# Patient Record
Sex: Male | Born: 1965
Health system: Southern US, Community
[De-identification: ages and names within clinical notes are randomized; demographics above are authoritative.]

## PROBLEM LIST (undated history)

## (undated) DIAGNOSIS — K219 Gastro-esophageal reflux disease without esophagitis: Secondary | ICD-10-CM

## (undated) DIAGNOSIS — T7840XA Allergy, unspecified, initial encounter: Secondary | ICD-10-CM

## (undated) HISTORY — PX: HERNIA REPAIR: SHX51

## (undated) HISTORY — DX: Gastro-esophageal reflux disease without esophagitis: K21.9

## (undated) HISTORY — DX: Allergy, unspecified, initial encounter: T78.40XA

---

## 2014-01-03 ENCOUNTER — Ambulatory Visit (INDEPENDENT_AMBULATORY_CARE_PROVIDER_SITE_OTHER): Payer: BC Managed Care – PPO | Admitting: Emergency Medicine

## 2014-01-03 VITALS — BP 116/60 | HR 64 | Temp 98.0°F | Resp 18 | Ht 67.0 in | Wt 173.0 lb

## 2014-01-03 DIAGNOSIS — K2990 Gastroduodenitis, unspecified, without bleeding: Principal | ICD-10-CM

## 2014-01-03 DIAGNOSIS — K2970 Gastritis, unspecified, without bleeding: Secondary | ICD-10-CM

## 2014-01-03 LAB — POCT CBC
Granulocyte percent: 68 %G (ref 37–80)
HCT, POC: 49.4 % (ref 43.5–53.7)
Hemoglobin: 15.9 g/dL (ref 14.1–18.1)
Lymph, poc: 2.7 (ref 0.6–3.4)
MCH, POC: 31.1 pg (ref 27–31.2)
MCHC: 32.3 g/dL (ref 31.8–35.4)
MCV: 96.4 fL (ref 80–97)
MID (cbc): 0.2 (ref 0–0.9)
MPV: 6.5 fL (ref 0–99.8)
POC Granulocyte: 6.1 (ref 2–6.9)
POC LYMPH PERCENT: 29.5 %L (ref 10–50)
POC MID %: 2.5 %M (ref 0–12)
Platelet Count, POC: 239 10*3/uL (ref 142–424)
RBC: 5.12 M/uL (ref 4.69–6.13)
RDW, POC: 14.6 %
WBC: 9 10*3/uL (ref 4.6–10.2)

## 2014-01-03 LAB — COMPREHENSIVE METABOLIC PANEL
ALT: 79 U/L — ABNORMAL HIGH (ref 0–53)
AST: 74 U/L — ABNORMAL HIGH (ref 0–37)
Albumin: 5 g/dL (ref 3.5–5.2)
Alkaline Phosphatase: 59 U/L (ref 39–117)
BUN: 12 mg/dL (ref 6–23)
CO2: 31 mEq/L (ref 19–32)
Calcium: 10.4 mg/dL (ref 8.4–10.5)
Chloride: 102 mEq/L (ref 96–112)
Creat: 0.89 mg/dL (ref 0.50–1.35)
Glucose, Bld: 86 mg/dL (ref 70–99)
Potassium: 4.3 mEq/L (ref 3.5–5.3)
Sodium: 141 mEq/L (ref 135–145)
Total Bilirubin: 0.5 mg/dL (ref 0.2–1.2)
Total Protein: 7.4 g/dL (ref 6.0–8.3)

## 2014-01-03 MED ORDER — LANSOPRAZOLE 30 MG PO CPDR: 30.0000 mg | DELAYED_RELEASE_CAPSULE | Freq: Every day | ORAL | Status: DC

## 2014-01-03 MED ORDER — SUCRALFATE 1 G PO TABS: ORAL_TABLET | ORAL | Status: DC

## 2014-01-03 NOTE — Patient Instructions (Signed)
Gastritis - Adultos  °(Gastritis, Adult) ° La gastrittis es la irritación (inflamación) de la membrana interna del estómago. Puede ser una enfermedad de inicio súbito (aguda) o de largo plazo (crónica). Si la gastritis no se trata, puede causar sangrado y úlceras. °CAUSAS  °La gastritis se produce cuando la membrana que tapiza interiormente al estómago se debilita o se daña. Los jugos digestivos del estómago inflaman el revestimiento del estómago debilitado. El revestimiento del estómago puede debilitarse o dañarse por una infección viral o bacteriana. La infección bacteriana más común es la infección por Helicobacter pylori. También puede ser el resultado del consumo excesivo de alcohol, por el uso de ciertos medicamentos o porque hay demasiado ácido en el estómago.  °SÍNTOMAS  °En algunos casos no hay síntomas. Si se presentan síntomas, éstos pueden ser:  °· Dolor o sensación de ardor en la parte superior del abdomen. °· Náuseas. °· Vómitos. °· Sensación molesta de distensión después de comer. °DIAGNÓSTICO  °El médico puede diagnosticar gastritis según los síntomas y el examen físico. Para determinar la causa de la gastritis, el médico podrá:  °· Pedir análisis de sangre o de materia fecal para diagnosticar la presencia de la bacteria H pylori. °· Gastroscopía. Un tubo delgado y flexible (endoscopio) se pasa por el esófago hasta llegar al estómago. El endoscopio tiene una luz y una cámara en el extremo. El médico utilizará el endoscopio para observar el interior del estómago. °· Tomará una muestra de tejido (biopsia) del estómago para examinarlo en el microscopio. °TRATAMIENTO  °Según la causa de la gastritis podrán recetarle: Antibióticos, si la causa es una infección bacteriana, como una infección por H. pylori. Antiácidos o bloqueadores H2, si hay demasiado ácido en el estómago. El médico le aconsejará que deje de tomar aspirina, ibuprofeno u otros antiinflamatorios no esteroides (AINE).  °INSTRUCCIONES PARA EL  CUIDADO EN EL HOGAR  °· Tome sólo medicamentos de venta libre o recetados, según las indicaciones del médico. °· Si le han recetado antibióticos, tómelos según las indicaciones. Tómelos todos, aunque se sienta mejor. °· Debe ingerir gran cantidad de líquido para mantener la orina de tono claro o color amarillo pálido. °· Evite las comidas y bebidas que empeoran los problemas, como: °¨ Bebidas con cafeína o alcohólicas. °¨ Chocolate. °¨ Sabores a menta. °¨ Ajo y cebolla. °¨ Comidas muy condimentadas. °¨ Cítricos como naranjas, limones o limas. °¨ Alimentos que contengan tomate, como salsas, chile y pizza. °¨ Alimentos fritos y grasos. °· Haga comidas pequeñas durante el día en lugar de 3 comidas abundantes. °SOLICITE ATENCIÓN MÉDICA DE INMEDIATO SI:  °· La materia fecal es negra o de color rojo oscuro. °· Vomita sangre de color rojo brillante o material similar a granos de café. °· No puede retener los líquidos. °· El dolor abdominal empeora. °· Tiene fiebre. °· No mejora luego de 1 semana. °· Tiene preguntas o preocupaciones. °ASEGÚRESE DE QUE:  °· Comprende estas instrucciones. °· Controlará su enfermedad. °· Solicitará ayuda de inmediato si no mejora o si empeora. °Document Released: 02/06/2005 Document Revised: 01/22/2012 °ExitCare® Patient Information ©2015 [employer]. This information is not intended to replace advice given to you by your health care provider. Make sure you discuss any questions you have with your health care provider. ° °

## 2014-01-03 NOTE — Progress Notes (Signed)
[facility] [address] [place] [address] [phone]  Date:  01/03/2014   Name:  Russell Hunt   DOB:  August 23, 1965   MRN:  [id number]  PCP:  No PCP Per Patient    Chief Complaint: Bloated   History of Present Illness:  Russell Hunt is a 48 y.o. very pleasant male patient who presents with the following:  History of three months duration bloating and abdominal pain with eating. No waterbrash.  Frequent heartburn. No nausea or vomiting. No excess alcohol or caffeine. Says pain increases with coffee.  Increases when he bends over. No blood in stools, black stools or vomiting blood.   No improvement with over the counter medications or other home remedies. /jd  There are no active problems to display for this patient.   Past Medical History  Diagnosis Date  . Allergy     Past Surgical History  Procedure Laterality Date  . Hernia repair      History  Substance Use Topics  . Smoking status: Current Every Day Smoker -- 0.50 packs/day for 30 years    Types: Cigarettes  . Smokeless tobacco: Not on file  . Alcohol Use: Yes    Family History  Problem Relation Age of Onset  . Diabetes Mother     No Known Allergies  Medication list has been reviewed and updated.  No current outpatient prescriptions on file prior to visit.   No current facility-administered medications on file prior to visit.    Review of Systems:  As per HPI, otherwise negative.    Physical Examination: Filed Vitals:   01/03/14 1558  BP: 116/60  Pulse: 64  Temp: 98 F (36.7 C)  Resp: 18   Filed Vitals:   01/03/14 1558  Height: 5\' 7"  (1.702 m)  Weight: 173 lb (78.472 kg)   Body mass index is 27.09 kg/(m^2). Ideal Body Weight: Weight in (lb) to have BMI = 25: 159.3  GEN: WDWN, NAD, Non-toxic, A & O x 3 HEENT: Atraumatic, Normocephalic. Neck supple. No masses, No LAD. Ears and Nose: No external deformity. CV: RRR, No M/G/R. No JVD. No thrill.  No extra heart sounds. PULM: CTA B, no wheezes, crackles, rhonchi. No retractions. No resp. distress. No accessory muscle use. ABD: S, NT, ND, +BS. No rebound. No HSM. EXTR: No c/c/e NEURO Normal gait.  PSYCH: Normally interactive. Conversant. Not depressed or anxious appearing.  Calm demeanor.    Assessment and Plan: Gastritis vs ulcer vs GB Prevacid Labs pending Follow up in one month    Signed,  Ellison Carwin, MD

## 2014-01-04 LAB — H. PYLORI ANTIBODY, IGG: H Pylori IgG: <0.4 {ISR}

## 2014-01-11 ENCOUNTER — Telehealth: Payer: Self-pay

## 2014-01-11 NOTE — Telephone Encounter (Signed)
Patient contacted [facility] for his lab results.   Bethena Roys at [facility] called for [place].  Bethena Roys can be reached at [phone]

## 2014-01-11 NOTE — Telephone Encounter (Signed)
Spoke to [place]- pt called [place] in error, he was trying to set up MyChart and called to reset password but he has not activated his account yet.

## 2014-01-11 NOTE — Telephone Encounter (Signed)
Spoke to pt- advised to activate MyChart with the code given at his appt. Pt advised of results.

## 2014-04-01 ENCOUNTER — Ambulatory Visit (INDEPENDENT_AMBULATORY_CARE_PROVIDER_SITE_OTHER): Payer: BC Managed Care – PPO | Admitting: Family Medicine

## 2014-04-01 VITALS — BP 126/78 | HR 61 | Temp 98.3°F | Resp 16 | Ht 67.5 in | Wt 174.2 lb

## 2014-04-01 DIAGNOSIS — T161XXA Foreign body in right ear, initial encounter: Secondary | ICD-10-CM

## 2014-04-01 NOTE — Progress Notes (Signed)
[facility] [address] [place] [address] [phone]  Date:  04/01/2014   Name:  Russell Hunt   DOB:  1966/01/03   MRN:  [id number]  PCP:  No PCP Per Patient    Chief Complaint: Ear Pain   History of Present Illness:  Russell Hunt is a 48 y.o. very pleasant male patient who presents with the following:  He is here today today with a possible retained piece of cotton in his right ear- happened yesterday.  It is uncomfortable since he got the qtip stuck.   OW generally healthy  There are no active problems to display for this patient.   Past Medical History  Diagnosis Date  . Allergy     Past Surgical History  Procedure Laterality Date  . Hernia repair      History  Substance Use Topics  . Smoking status: Current Every Day Smoker -- 0.50 packs/day for 30 years    Types: Cigarettes  . Smokeless tobacco: Not on file  . Alcohol Use: Yes    Family History  Problem Relation Age of Onset  . Diabetes Mother     No Known Allergies  Medication list has been reviewed and updated.  No current outpatient prescriptions on file prior to visit.   No current facility-administered medications on file prior to visit.    Review of Systems:  As per HPI- otherwise negative.   Physical Examination: Filed Vitals:   04/01/14 1251  BP: 126/78  Pulse: 61  Temp: 98.3 F (36.8 C)  Resp: 16   Filed Vitals:   04/01/14 1251  Height: 5' 7.5" (1.715 m)  Weight: 174 lb 3.2 oz (79.017 kg)   Body mass index is 26.87 kg/(m^2). Ideal Body Weight: Weight in (lb) to have BMI = 25: 161.7   GEN: WDWN, NAD, Non-toxic, Alert & Oriented x 3 HEENT: Atraumatic, Normocephalic.   Left ear canal and TM wnl Ears and Nose: No external deformity. EXTR: No clubbing/cyanosis/edema NEURO: Normal gait.  PSYCH: Normally interactive. Conversant. Not depressed or anxious appearing.  Calm demeanor.  [place] visualized in right ear.  Removed with  alligator forceps. TM and ear canal then appeared normal   Assessment and Plan: Foreign body in right ear, initial encounter  Resolved as above.  Follow-up if needed  Signed Lamar Blinks, MD

## 2014-06-15 ENCOUNTER — Ambulatory Visit (INDEPENDENT_AMBULATORY_CARE_PROVIDER_SITE_OTHER): Payer: BLUE CROSS/BLUE SHIELD | Admitting: Urgent Care

## 2014-06-15 ENCOUNTER — Ambulatory Visit (INDEPENDENT_AMBULATORY_CARE_PROVIDER_SITE_OTHER): Payer: BLUE CROSS/BLUE SHIELD

## 2014-06-15 ENCOUNTER — Other Ambulatory Visit: Payer: Self-pay | Admitting: Urgent Care

## 2014-06-15 VITALS — BP 108/70 | HR 56 | Temp 98.3°F | Resp 16 | Ht 67.0 in | Wt 173.0 lb

## 2014-06-15 DIAGNOSIS — M545 Low back pain: Secondary | ICD-10-CM

## 2014-06-15 DIAGNOSIS — R748 Abnormal levels of other serum enzymes: Secondary | ICD-10-CM

## 2014-06-15 DIAGNOSIS — M542 Cervicalgia: Secondary | ICD-10-CM

## 2014-06-15 DIAGNOSIS — R1032 Left lower quadrant pain: Secondary | ICD-10-CM

## 2014-06-15 DIAGNOSIS — Z131 Encounter for screening for diabetes mellitus: Secondary | ICD-10-CM

## 2014-06-15 DIAGNOSIS — M79622 Pain in left upper arm: Secondary | ICD-10-CM

## 2014-06-15 LAB — HEMOGLOBIN A1C
Hgb A1c MFr Bld: 5.4 % (ref <5.7)
Mean Plasma Glucose: 108 mg/dL (ref <117)

## 2014-06-15 LAB — POCT URINALYSIS DIPSTICK
Bilirubin, UA: NEGATIVE
Blood, UA: NEGATIVE
Glucose, UA: NEGATIVE
Ketones, UA: NEGATIVE
Leukocytes, UA: NEGATIVE
Nitrite, UA: NEGATIVE
Protein, UA: NEGATIVE
Spec Grav, UA: 1.025
Urobilinogen, UA: 0.2
pH, UA: 5.5

## 2014-06-15 LAB — POCT CBC
Granulocyte percent: 61.1 %G (ref 37–80)
HCT, POC: 49.5 % (ref 43.5–53.7)
Hemoglobin: 16.2 g/dL (ref 14.1–18.1)
Lymph, poc: 2.6 (ref 0.6–3.4)
MCH, POC: 31.6 pg — AB (ref 27–31.2)
MCHC: 32.8 g/dL (ref 31.8–35.4)
MCV: 96.4 fL (ref 80–97)
MID (cbc): 0.6 (ref 0–0.9)
MPV: 6.1 fL (ref 0–99.8)
POC Granulocyte: 4.9 (ref 2–6.9)
POC LYMPH PERCENT: 32.1 %L (ref 10–50)
POC MID %: 6.8 %M (ref 0–12)
Platelet Count, POC: 259 10*3/uL (ref 142–424)
RBC: 5.13 M/uL (ref 4.69–6.13)
RDW, POC: 14.6 %
WBC: 8.1 10*3/uL (ref 4.6–10.2)

## 2014-06-15 LAB — POCT UA - MICROSCOPIC ONLY
Bacteria, U Microscopic: NEGATIVE
Casts, Ur, LPF, POC: NEGATIVE
Crystals, Ur, HPF, POC: NEGATIVE
Mucus, UA: NEGATIVE
RBC, urine, microscopic: NEGATIVE
WBC, Ur, HPF, POC: NEGATIVE
Yeast, UA: NEGATIVE

## 2014-06-15 LAB — LIPID PANEL
Cholesterol: 216 mg/dL — ABNORMAL HIGH (ref 0–200)
HDL: 56 mg/dL (ref >39)
LDL Cholesterol: 143 mg/dL — ABNORMAL HIGH (ref 0–99)
Total CHOL/HDL Ratio: 3.9 Ratio
Triglycerides: 84 mg/dL (ref <150)
VLDL: 17 mg/dL (ref 0–40)

## 2014-06-15 MED ORDER — MELOXICAM 7.5 MG PO TABS: 7.5000 mg | ORAL_TABLET | Freq: Every day | ORAL | Status: AC

## 2014-06-15 MED ORDER — CYCLOBENZAPRINE HCL 10 MG PO TABS: 10.0000 mg | ORAL_TABLET | Freq: Three times a day (TID) | ORAL | Status: DC | PRN

## 2014-06-15 NOTE — Patient Instructions (Signed)
Estreimiento (Constipation) Estreimiento significa que una persona tiene menos de tres evacuaciones en una semana, dificultad para defecar, o que las heces son secas, duras, o ms grandes que lo normal. A medida que envejecemos el estreimiento es ms comn. Si intenta curar el estreimiento con medicamentos que producen la evacuacin de las heces (laxantes), el problema puede empeorar. El uso prolongado de laxantes puede hacer que los msculos del colon se debiliten. Una dieta baja en fibra, no tomar suficientes lquidos y el uso de ciertos medicamentos pueden Agricultural engineer.  CAUSAS   Ciertos medicamentos, como los antidepresivos, analgsicos, suplementos de hierro, anticidos y diurticos.  Algunas enfermedades, como la diabetes, el sndrome del colon irritable, enfermedad de la tiroides, o depresin.  No beber suficiente agua.  No consumir suficientes alimentos ricos en fibra.  Situaciones de estrs o viajes.  Falta de actividad fsica o de ejercicio.  Ignorar la necesidad sbita de Landscape architect.  Uso en exceso de laxantes. SIGNOS Y SNTOMAS   Defecar menos de tres veces por semana.  Dificultad para defecar.  Tener las heces secas y duras, o ms grandes que las normales.  Sensacin de estar lleno o hinchado.  Dolor en la parte baja del abdomen.  No sentir alivio despus de defecar. DIAGNSTICO  El mdico le har una historia clnica y un examen fsico. Pueden hacerle exmenes adicionales para el estreimiento grave. Estos estudios pueden ser:  Un radiografa con enema de bario para examinar el recto, el colon y, en algunos casos, el intestino delgado.  Una sigmoidoscopia para examinar el colon inferior.  Una colonoscopia para examinar todo el colon. TRATAMIENTO  El tratamiento depender de la gravedad del estreimiento y de la causa. Algunos tratamientos nutricionales son beber ms lquidos y comer ms alimentos ricos en fibra. El cambio en el estilo de vida  incluye hacer ejercicios de [place] regular. Si estas recomendaciones para Animator dieta y en el estilo de vida no ayudan, el mdico le puede indicar el uso de laxantes de venta libre para ayudarlo a Landscape architect. Los medicamentos recetados se pueden prescribir si los medicamentos de venta libre no lo [place].  INSTRUCCIONES PARA EL CUIDADO EN EL HOGAR   Consuma alimentos con alto contenido de [place], como frutas, vegetales, cereales integrales y porotos.  Limite los alimentos procesados ricos en grasas y azcar, como las papas fritas, hamburguesas, galletas, dulces y refrescos.  Puede agregar un suplemento de fibra a su dieta si no obtiene lo suficiente de los alimentos.  Beba suficiente lquido para Consulting civil engineer orina clara o de color amarillo plido.  Haga ejercicio regularmente o segn las indicaciones del mdico.  Vaya al bao cuando sienta la necesidad de ir. No se aguante las ganas.  Tome solo medicamentos de venta libre o recetados, segn las indicaciones del mdico. No tome otros medicamentos para el estreimiento sin consultarlo antes con su mdico. SOLICITE ATENCIN MDICA DE INMEDIATO SI:   Observa sangre brillante en las heces.  El estreimiento dura ms de 4 das o [place].  Siente dolor abdominal o rectal.  Las heces son delgadas como un lpiz.  Pierde peso de [place] inexplicable. ASEGRESE DE QUE:   Comprende estas instrucciones.  Controlar su afeccin.  Recibir ayuda de inmediato si no mejora o si empeora. Document Released: 05/19/2007 Document Revised: 05/04/2013 [facility] Patient Information 2015 [place]. This information is not intended to replace advice given to you by your health care provider. Make sure you discuss any questions you have with your health  care provider.    Dolor abdominal (Abdominal Pain) El dolor puede tener muchas causas. Normalmente la causa del dolor abdominal no es una enfermedad y Teacher, English as a foreign language sin Clinical research associate.  Frecuentemente puede controlarse y tratarse en casa. Su mdico le Chartered certified accountant examen fsico y posiblemente solicite anlisis de sangre y radiografas para ayudar a Teacher, adult education la gravedad de su dolor. Sin embargo, en [employer], debe transcurrir ms tiempo antes de que se pueda Pension scheme manager una causa evidente del dolor. Antes de llegar a ese punto, es posible que su mdico no sepa si necesita ms pruebas o un tratamiento ms profundo. INSTRUCCIONES PARA EL CUIDADO EN EL HOGAR  Est atento al dolor para ver si hay cambios. Las siguientes indicaciones ayudarn a Chief Strategy Officer que pueda sentir:  Hana solo medicamentos de venta libre o recetados, segn las indicaciones del mdico.  No tome laxantes a menos que se lo haya indicado su mdico.  Pruebe con Ardelia Mems dieta lquida absoluta (caldo, t o agua) segn se lo indique su mdico. Introduzca gradualmente una dieta normal, segn su tolerancia. SOLICITE ATENCIN MDICA SI:  Tiene dolor abdominal sin explicacin.  Tiene dolor abdominal relacionado con nuseas o diarrea.  Tiene dolor cuando orina o defeca.  Experimenta dolor abdominal que lo despierta de noche.  Tiene dolor abdominal que empeora o mejora cuando come alimentos.  Tiene dolor abdominal que empeora cuando come alimentos grasosos.  Tiene fiebre. SOLICITE ATENCIN MDICA DE INMEDIATO SI:   El dolor no desaparece en un plazo mximo de 2horas.  No deja de (vomitar).  El Social research officer, government se siente solo en partes del abdomen, como el lado derecho o la parte inferior izquierda del abdomen.  Evaca materia fecal sanguinolenta o negra, de aspecto alquitranado. ASEGRESE DE QUE:  Comprende estas instrucciones.  Controlar su afeccin.  Recibir ayuda de inmediato si no mejora o si empeora. Document Released: 04/29/2005 Document Revised: 05/04/2013 [facility] Patient Information 2015 [place]. This information is not intended to replace advice given to you by your health care  provider. Make sure you discuss any questions you have with your health care provider.

## 2014-06-15 NOTE — Progress Notes (Signed)
MRN: [id number] DOB: 10-07-1965  Subjective:   Russell Hunt is a 49 y.o. male presenting for chief complaint of Abdominal Pain and Pain in lower back and pelvis  Abdominal pain - reports several month history of lower abdominal pain, bloating, worse with eating. Was seen at [facility] for same complaint in 12/2013, rx prevacid, has also tried omeprazole and Miralax with minimal relief. Associated symptoms include hard stools, straining, has 1 BM per day, low back pain. Low back pain is achy, non-radiating, worsened with prolonged sitting, quick movements. Has to carefully stand from sitting position due to pain. Denies fevers, n/v, diarrhea, bloody stool, chest pain, shob, cough, sore throat, sour brash. Denies family history of colon cancer. Diet is very unhealthy, low fiber, hardly drinks any water. Does not exercise. Mother diagnosed with DM at ~60 y/o.  Neck/right arm pain - reports 1 month history of neck pain radiating to right shoulder/arm. Feels heaviness. Now occuring daily in the evenings after work. Has not tried any interventions for this. Denies numbness, tingling sensation, injury or trauma. 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  Smokes 1/4ppd, 6-12 beers in a sitting once a month. Denies any other aggravating or relieving factors, no other questions or concerns.  Dvid has a current medication list which includes the following prescription(s): polyethylene glycol 3350, cyclobenzaprine, meloxicam, and omeprazole.  He has No Known Allergies.  [place]  has a past medical history of Allergy. Also  has past surgical history that includes Hernia repair.  ROS As in subjective.  Objective:   Vitals: BP 108/70 mmHg  Pulse 56  Temp(Src) 98.3 F (36.8 C) (Oral)  Resp 16  Ht 5\' 7"  (1.702 m)  Wt 173 lb (78.472 kg)  BMI 27.09 kg/m2  SpO2 100%  Physical Exam  Constitutional: He is oriented to person,  place, and time and well-developed, well-nourished, and in no distress.  Cardiovascular: Normal rate, regular rhythm, normal heart sounds and intact distal pulses.  Exam reveals no gallop and no friction rub.   No murmur heard. Pulmonary/Chest: Effort normal and breath sounds normal. No respiratory distress. He has no wheezes. He has no rales. He exhibits no tenderness.  Abdominal: Soft. Bowel sounds are normal. He exhibits no distension and no mass. There is tenderness (in lower abdomen, L>R). There is no guarding.  Musculoskeletal:       Right shoulder: He exhibits normal range of motion, no tenderness, no bony tenderness, no swelling, no effusion, no crepitus and no deformity.       Right elbow: He exhibits normal range of motion, no swelling and no deformity. No tenderness found.       Right wrist: He exhibits normal range of motion, no tenderness, no bony tenderness, no swelling and no crepitus.       Cervical back: He exhibits normal range of motion, no tenderness, no bony tenderness, no swelling, no edema, no deformity and no spasm.       Lumbar back: He exhibits decreased range of motion (on flexion and extension), tenderness and spasm. He exhibits no bony tenderness, no swelling, no edema and no deformity.  Neurological: He is alert and oriented to person, place, and time.  Skin: Skin is warm and dry. No rash noted. No erythema.  Psychiatric: Mood and affect normal.   Results for orders placed or performed in visit on 06/15/14 (from the past 24 hour(s))  POCT CBC  Status: Abnormal   Collection Time: 06/15/14  1:06 PM  Result Value Ref Range   WBC 8.1 4.6 - 10.2 K/uL   Lymph, poc 2.6 0.6 - 3.4   POC LYMPH PERCENT 32.1 10 - 50 %L   MID (cbc) 0.6 0 - 0.9   POC MID % 6.8 0 - 12 %M   POC Granulocyte 4.9 2 - 6.9   Granulocyte percent 61.1 37 - 80 %G   RBC 5.13 4.69 - 6.13 M/uL   Hemoglobin 16.2 14.1 - 18.1 g/dL   HCT, POC 49.5 43.5 - 53.7 %   MCV 96.4 80 - 97 fL   MCH, POC 31.6  (A) 27 - 31.2 pg   MCHC 32.8 31.8 - 35.4 g/dL   RDW, POC 14.6 %   Platelet Count, POC 259 142 - 424 K/uL   MPV 6.1 0 - 99.8 fL  POCT urinalysis dipstick     Status: None   Collection Time: 06/15/14  1:34 PM  Result Value Ref Range   Color, UA yellow    Clarity, UA clear    Glucose, UA neg    Bilirubin, UA neg    Ketones, UA neg    Spec Grav, UA 1.025    Blood, UA neg    pH, UA 5.5    Protein, UA neg    Urobilinogen, UA 0.2    Nitrite, UA neg    Leukocytes, UA Negative   POCT UA - Microscopic Only     Status: None   Collection Time: 06/15/14  1:34 PM  Result Value Ref Range   WBC, Ur, HPF, POC neg    RBC, urine, microscopic neg    Bacteria, U Microscopic neg    Mucus, UA mneg    Epithelial cells, urine per micros 0-1    Crystals, Ur, HPF, POC neg    Casts, Ur, LPF, POC neg    Yeast, UA neg    [facility] reading (PRIMARY) by  Dr. Joseph Art and PA-Braxden Lovering. KUB: Normal gas-bowel pattern. Lumbar spine: Mild degenerative changes at the level of L3-L5.  Dg Lumbar Spine 2-3 Views  06/15/2014   CLINICAL DATA:  Low back pain without sciatic symptoms. No reported acute injury. Initial encounter.  EXAM: LUMBAR SPINE - 2-3 VIEW  COMPARISON:  One view abdomen 06/15/2010.  FINDINGS: There are 5 lumbar type vertebral bodies. There is some rotation on the lateral view. The alignment is near anatomic. There is a mild scoliosis. There are mild degenerative changes throughout the lumbar spine with disc space loss, intervertebral spurring and facet hypertrophy. No evidence of acute fracture or pars defect. A peripherally sclerotic lesion in the right superior acetabulum has a nonaggressive appearance.  IMPRESSION: Mild multilevel spondylosis. No acute osseous findings or significant malalignment.   Electronically Signed   By: Camie Patience M.D.   On: 06/15/2014 14:05   Dg Abd 1 View  06/15/2014   CLINICAL DATA:  Left lower quadrant pain, acute  EXAM: ABDOMEN - 1 VIEW  COMPARISON:  None.  FINDINGS: There is  moderate stool in the colon. There is no bowel dilatation or air-fluid level suggesting obstruction. No free air is seen on this supine examination. There are small phleboliths in the pelvis.  IMPRESSION: Moderate stool in colon.  Bowel gas pattern unremarkable.   Electronically Signed   By: Lowella Grip M.D.   On: 06/15/2014 14:03   Assessment and Plan :   1. Left lower quadrant pain - X-ray, CBC reassuring - Advised  healthy diet and exercise, increased fiber intake, may continue to use Miralax for constipation - Follow up after referral to GI complete  UPDATE: Radiologist over-read confirms moderate stool burden. Continue with plan of dietary modifications and symptomatic treatment with Miralax, will also recommend docusate over the counter as needed  2. Low back pain without sciatica, unspecified back pain laterality - may be due to arthritic changes, possible constipation despite x-[facility] findings at [facility] - advised symptomatic tx with meloxicam and flexeril  3. Elevated liver enzymes - Cmet pending, advised to decrease alcohol use  4. Diabetes mellitus screening - A1c is 5.2 per the lab, will monitor, advised healthy diet and exercise and as above  5. Left upper arm pain 6. Neck pain - likely due to overuse from work - advised symptomatic tx with meloxicam and flexeril   [name], PA-C [facility] [phone] 06/15/2014 1:50 PM

## 2014-06-16 LAB — COMPREHENSIVE METABOLIC PANEL
ALT: 18 U/L (ref 0–53)
AST: 23 U/L (ref 0–37)
Albumin: 4.6 g/dL (ref 3.5–5.2)
Alkaline Phosphatase: 65 U/L (ref 39–117)
BUN: 12 mg/dL (ref 6–23)
CO2: 29 mEq/L (ref 19–32)
Calcium: 10.2 mg/dL (ref 8.4–10.5)
Chloride: 103 mEq/L (ref 96–112)
Creat: 0.95 mg/dL (ref 0.50–1.35)
Glucose, Bld: 93 mg/dL (ref 70–99)
Potassium: 4.5 mEq/L (ref 3.5–5.3)
Sodium: 139 mEq/L (ref 135–145)
Total Bilirubin: 0.7 mg/dL (ref 0.2–1.2)
Total Protein: 7.1 g/dL (ref 6.0–8.3)

## 2014-06-17 LAB — POC HEMOCCULT BLD/STL (HOME/3-CARD/SCREEN)
Card #2 Fecal Occult Blod, POC: NEGATIVE
Card #3 Fecal Occult Blood, POC: NEGATIVE
Fecal Occult Blood, POC: NEGATIVE

## 2014-06-17 NOTE — Addendum Note (Signed)
Addended byWalden Field A on: 06/17/2014 04:36 PM   Modules accepted: Orders

## 2014-06-20 ENCOUNTER — Encounter: Payer: Self-pay | Admitting: Urgent Care

## 2014-07-11 ENCOUNTER — Other Ambulatory Visit: Payer: Self-pay | Admitting: Urgent Care

## 2014-07-11 ENCOUNTER — Telehealth: Payer: Self-pay

## 2014-07-11 NOTE — Telephone Encounter (Signed)
Yes this is okay to refill. Thank you!  [name], PA-C [facility] [phone] 07/11/2014  7:27 PM

## 2014-07-11 NOTE — Telephone Encounter (Signed)
Spoke with pt, he would like a refill on his Flexeril. Communication barrier.

## 2014-07-12 NOTE — Telephone Encounter (Signed)
Pt was notified in [place], in response to his Mychart request for RF.

## 2014-07-12 NOTE — Telephone Encounter (Signed)
Notified pt in Mychart that RF was sent.

## 2014-07-12 NOTE — Telephone Encounter (Signed)
Rx sent in, I tried to call pt but number is unavailable.

## 2015-05-26 ENCOUNTER — Emergency Department (HOSPITAL_COMMUNITY)
Admission: EM | Admit: 2015-05-26 | Discharge: 2015-05-27 | Disposition: A | Discharge status: Home / Self Care | Payer: BLUE CROSS/BLUE SHIELD | Attending: Emergency Medicine | Admitting: Emergency Medicine

## 2015-05-26 ENCOUNTER — Encounter (HOSPITAL_COMMUNITY): Payer: Self-pay | Admitting: Emergency Medicine

## 2015-05-26 DIAGNOSIS — F17210 Nicotine dependence, cigarettes, uncomplicated: Secondary | ICD-10-CM | POA: Insufficient documentation

## 2015-05-26 DIAGNOSIS — R1032 Left lower quadrant pain: Secondary | ICD-10-CM | POA: Diagnosis present

## 2015-05-26 DIAGNOSIS — Z9889 Other specified postprocedural states: Secondary | ICD-10-CM | POA: Insufficient documentation

## 2015-05-26 DIAGNOSIS — N200 Calculus of kidney: Secondary | ICD-10-CM | POA: Diagnosis not present

## 2015-05-26 DIAGNOSIS — Z79899 Other long term (current) drug therapy: Secondary | ICD-10-CM | POA: Insufficient documentation

## 2015-05-26 DIAGNOSIS — Z791 Long term (current) use of non-steroidal anti-inflammatories (NSAID): Secondary | ICD-10-CM | POA: Insufficient documentation

## 2015-05-26 LAB — URINE MICROSCOPIC-ADD ON

## 2015-05-26 LAB — URINALYSIS, ROUTINE W REFLEX MICROSCOPIC
Bilirubin Urine: NEGATIVE
Glucose, UA: NEGATIVE mg/dL
Ketones, ur: NEGATIVE mg/dL
Leukocytes, UA: NEGATIVE
Nitrite: NEGATIVE
Protein, ur: NEGATIVE mg/dL
Specific Gravity, Urine: 1.028 (ref 1.005–1.030)
pH: 5.5 (ref 5.0–8.0)

## 2015-05-26 NOTE — ED Notes (Signed)
Patient presents with left flank pain, urinary frequency, and LLQ abdominal pain starting earlier this evening. Denies N/V/D, fever or chills. Rates pain 9/10.

## 2015-05-27 ENCOUNTER — Emergency Department (HOSPITAL_COMMUNITY): Payer: BLUE CROSS/BLUE SHIELD

## 2015-05-27 LAB — COMPREHENSIVE METABOLIC PANEL
ALT: 15 U/L — ABNORMAL LOW (ref 17–63)
AST: 23 U/L (ref 15–41)
Albumin: 4.8 g/dL (ref 3.5–5.0)
Alkaline Phosphatase: 67 U/L (ref 38–126)
Anion gap: 11 (ref 5–15)
BUN: 13 mg/dL (ref 6–20)
CO2: 24 mmol/L (ref 22–32)
Calcium: 9.3 mg/dL (ref 8.9–10.3)
Chloride: 104 mmol/L (ref 101–111)
Creatinine, Ser: 0.98 mg/dL (ref 0.61–1.24)
GFR calc Af Amer: >60 mL/min (ref >60)
GFR calc non Af Amer: >60 mL/min (ref >60)
Glucose, Bld: 112 mg/dL — ABNORMAL HIGH (ref 65–99)
Potassium: 3.9 mmol/L (ref 3.5–5.1)
Sodium: 139 mmol/L (ref 135–145)
Total Bilirubin: 0.9 mg/dL (ref 0.3–1.2)
Total Protein: 7.3 g/dL (ref 6.5–8.1)

## 2015-05-27 LAB — CBC
HCT: 43.4 % (ref 39.0–52.0)
Hemoglobin: 15.6 g/dL (ref 13.0–17.0)
MCH: 32.4 pg (ref 26.0–34.0)
MCHC: 35.9 g/dL (ref 30.0–36.0)
MCV: 90 fL (ref 78.0–100.0)
Platelets: 246 10*3/uL (ref 150–400)
RBC: 4.82 MIL/uL (ref 4.22–5.81)
RDW: 13 % (ref 11.5–15.5)
WBC: 12.8 10*3/uL — ABNORMAL HIGH (ref 4.0–10.5)

## 2015-05-27 LAB — LIPASE, BLOOD: Lipase: 24 U/L (ref 11–51)

## 2015-05-27 MED ORDER — ONDANSETRON HCL 4 MG/2ML IJ SOLN: 4.0000 mg | Freq: Once | INTRAMUSCULAR | Status: DC

## 2015-05-27 MED ORDER — HYDROCODONE-ACETAMINOPHEN 5-325 MG PO TABS: 1.0000 | ORAL_TABLET | Freq: Four times a day (QID) | ORAL | Status: DC | PRN

## 2015-05-27 MED ORDER — FENTANYL CITRATE (PF) 100 MCG/2ML IJ SOLN: 50.0000 ug | Freq: Once | INTRAMUSCULAR | Status: DC

## 2015-05-27 NOTE — Discharge Instructions (Signed)
Clculos renales (Kidney Stones) Los clculos renales (urolitiasis) son masas slidas que se forman en el interior de los riones. El dolor intenso es causado por el movimiento de la piedra a travs del tracto urinario. Cuando la piedra se mueve, el urter hace un espasmo alrededor de la misma. El clculo generalmente se elimina con la orina.  CAUSAS   Un trastorno que hace que ciertas glndulas del cuello produzcan demasiada hormona paratiroidea (hiperparatiroidismo primario).  Una acumulacin de cristales de cido rico, similar a la gota en las articulaciones.  Estrechamiento (constriccin) del urter.  Obstruccin en el rin presente al nacer (obstruccin congnita).  Cirugas previas del rin o los urteres.  Numerosas infecciones renales. SNTOMAS   Ganas de vomitar (nuseas).  Devolver la comida (vomitar).  Sangre en la orina (hematuria).  Dolor que generalmente se expande (irradia) hacia la ingle.  Ganas de orinar con frecuencia o de manera urgente. DIAGNSTICO   Historia clnica y examen fsico.  Anlisis de sangre y orina.  Tomografa computada.  En algunos casos se realiza un examen del interior de la vejiga (citoscopa). TRATAMIENTO   Observacin.  Aumentar la ingesta de lquidos.  Litotricia extracorprea con ondas de choque: es un procedimiento no invasivo que utiliza ondas de choque para romper los clculos renales.  Ser necesaria la ciruga si tiene dolor muy intenso o la obstruccin persiste. Hay varios procedimientos quirrgicos. La mayora de los procedimientos se realizan con el uso de pequeos instrumentos. Slo es necesario realizar pequeas incisiones para acomodar estos instrumentos, por lo tanto el tiempo de recuperacin es mnimo. El tamao, la ubicacin y la composicin qumica de los clculos son variables importantes que determinarn la eleccin correcta de tratamiento para su caso. Comunquese con su mdico para comprender mejor su  situacin, de modo que pueda minimizar los riesgos de lesiones para usted y su rin.  INSTRUCCIONES PARA EL CUIDADO EN EL HOGAR   Beba gran cantidad de lquido para mantener la orina de tono claro o color amarillo plido. Esto ayudar a eliminar las piedras o los fragmentos.  Cuele la orina con el colador que le han provisto. Guarde todas las partculas y piedras para que las vea el profesional que lo asiste. Puede ser tan pequea como un grano de sal. Es muy importante usar el colador cada vez que orine. La recoleccin de piedras permitir al mdico analizar y verificar que efectivamente ha eliminado una piedra. El anlisis de la piedra con frecuencia permitir identificar qu puede hacer para reducir la incidencia de las recurrencias.  Slo tome medicamentos de venta libre o recetados para calmar el dolor, el malestar o bajar la fiebre, segn las indicaciones de su mdico.  Concurra a todas las visitas de control como se lo haya indicado el mdico. Esto es importante.  Si se lo indica, hgase radiografas. La ausencia de dolor no siempre significa que las piedras se han eliminado. Puede ser que simplemente hayan dejado de moverse. Si el paso de orina permanece completamente obstruido, puede causar prdida de la funcin renal o simplemente la destruccin del rin. Es su responsabilidad completar el seguimiento y las radiografas. Las ecografas del rin pueden mostrar una obstruccin y el estado del rin. Las ecografas no se asocian con la radiacin y pueden realizarse fcilmente en cuestin de minutos.  Haga cambios en la dieta diaria como se lo haya indicado el mdico. Es posible que le indiquen lo siguiente:  Limitar la cantidad de sal que consume.  Consumir 5 o ms porciones de frutas   y verduras por da.  Limitar la cantidad de carne, carne de ave, pescado y huevos que consume.  Recoger una muestra de orina durante 24 horas como se lo haya indicado el mdico. Tal vez tenga que recoger  otra muestra de orina cada 6 o 12 meses. SOLICITE ATENCIN MDICA SI:  Siente dolor que no responde a los analgsicos que le recetaron. SOLICITE ATENCIN MDICA DE INMEDIATO SI:   No puede controlar el dolor con los medicamentos que le han recetado.  Siente escalofros o fiebre.  La gravedad o la intensidad del dolor aumenta durante 18 horas y no se alivia con los analgsicos.  Presenta un nuevo episodio de dolor abdominal.  Sufre mareos o se desmaya.  No puede orinar.   Esta informacin no tiene como fin reemplazar el consejo del mdico. Asegrese de hacerle al mdico cualquier pregunta que tenga.   Document Released: 04/29/2005 Document Revised: 01/18/2015 Elsevier Interactive Patient Education 2016 [employer]  

## 2015-05-27 NOTE — ED Provider Notes (Signed)
CSN: [id number]     Arrival date & time 05/26/15  2232 History  By signing my name below, I, Russell Hunt, attest that this documentation has been prepared under the direction and in the presence of Leo Grosser, MD. Electronically Signed: Irene Hunt, ED Scribe. 05/27/2015. 2:40 AM.   Chief Complaint  Patient presents with  . Flank Pain   The history is provided by the patient. No language interpreter was used.  HPI Comments: Russell Hunt is a 50 y.o. male who presents to the Emergency Department complaining of left flank pain onset 5 hours ago. Pt reports associated urinary frequency, dysuria, and LLQ abdominal pain. He states that he cannot stand up straight due to pain, but has decreased pain right now. Pt has taken ibuprofen to mild relief. He denies fever, chills, nausea, vomiting, groin pain, hematochezia or diarrhea.  Past Medical History  Diagnosis Date  . Allergy    Past Surgical History  Procedure Laterality Date  . Hernia repair     Family History  Problem Relation Age of Onset  . Diabetes Mother    Social History  Substance Use Topics  . Smoking status: Current Every Day Smoker -- 0.50 packs/day for 30 years    Types: Cigarettes  . Smokeless tobacco: None  . Alcohol Use: Yes    Review of Systems  Constitutional: Negative for fever and chills.  Gastrointestinal: Positive for abdominal pain. Negative for nausea, vomiting, diarrhea and blood in stool.  Genitourinary: Positive for dysuria, frequency and flank pain.  All other systems reviewed and are negative.  Allergies  Review of patient's allergies indicates no known allergies.  Home Medications   Prior to Admission medications   Medication Sig Start Date End Date Taking? Authorizing Provider  cyclobenzaprine (FLEXERIL) 10 MG tablet TAKE 1 TABLET BY MOUTH THREE TIMES DAILY AS NEEDED FOR MUSCLE SPASMS 07/12/14   [name], PA-C  meloxicam (MOBIC) 7.5 MG tablet Take 1 tablet (7.5 mg total) by mouth  daily. 06/15/14   [name], PA-C  Omeprazole (PRILOSEC PO) Take by mouth.    Historical Provider, MD  Polyethylene Glycol 3350 (MIRALAX PO) Take by mouth once.    Historical Provider, MD   BP 133/90 mmHg  Pulse 60  Temp(Src) 97.6 F (36.4 C) (Oral)  Resp 20  SpO2 100% Physical Exam  Constitutional: He is oriented to person, place, and time. He appears well-developed and well-nourished. No distress.  HENT:  Head: Normocephalic and atraumatic.  Mouth/Throat: Oropharynx is clear and moist. No oropharyngeal exudate.  Trachea midline  Eyes: Conjunctivae and EOM are normal. Pupils are equal, round, and reactive to light.  Neck: Trachea normal and normal range of motion. Neck supple. No JVD present. Carotid bruit is not present.  Cardiovascular: Normal rate and regular rhythm.  Exam reveals no gallop and no friction rub.   No murmur heard. Pulmonary/Chest: Effort normal and breath sounds normal. No stridor. He has no wheezes. He has no rales.  Abdominal: Soft. Bowel sounds are normal. He exhibits no mass. There is tenderness in the left lower quadrant. There is no rebound and no guarding.  Focal LLQ tenderness  Musculoskeletal: Normal range of motion.  Lymphadenopathy:    He has no cervical adenopathy.  Neurological: He is alert and oriented to person, place, and time. He has normal reflexes. No cranial nerve deficit. He exhibits normal muscle tone. Coordination normal.  Cranial nerves 2-12 intact  Skin: Skin is warm and dry. He is not diaphoretic.  Psychiatric: He  has a normal mood and affect. His behavior is normal.    ED Course  Procedures (including critical care time) DIAGNOSTIC STUDIES: Oxygen Saturation is 100% on RA, normal by my interpretation.    COORDINATION OF CARE: 1:06 AM-Discussed treatment plan which includes labs and CT scan with pt at bedside and pt agreed to plan.    Labs Review Labs Reviewed  COMPREHENSIVE METABOLIC PANEL - Abnormal; Notable for the following:     Glucose, Bld 112 (*)    ALT 15 (*)    All other components within normal limits  CBC - Abnormal; Notable for the following:    WBC 12.8 (*)    All other components within normal limits  URINALYSIS, ROUTINE W REFLEX MICROSCOPIC (NOT AT [facility]) - Abnormal; Notable for the following:    Color, Urine AMBER (*)    Hgb urine dipstick LARGE (*)    All other components within normal limits  URINE MICROSCOPIC-ADD ON - Abnormal; Notable for the following:    Squamous Epithelial / LPF 0-5 (*)    Bacteria, UA RARE (*)    All other components within normal limits  LIPASE, BLOOD    Imaging Review Ct Renal Stone Study  05/27/2015  CLINICAL DATA:  51 year old male with left lower quadrant abdominal pain EXAM: CT ABDOMEN AND PELVIS WITHOUT CONTRAST TECHNIQUE: Multidetector CT imaging of the abdomen and pelvis was performed following the standard protocol without IV contrast. COMPARISON:  Abdominal radiograph dated 06/15/2014 FINDINGS: Evaluation of this exam is limited in the absence of intravenous contrast. The visualized lung bases are clear. No intra-abdominal free air or free fluid. The liver, gallbladder, pancreas, spleen, adrenal glands, kidneys, visualized ureters appear unremarkable. There is a 3 mm stone along the posterior wall of the urinary bladder adjacent to the left UVJ which may represent a recently passed left renal calculus versus a left UVJ stone. The prostate and seminal vesicles are grossly unremarkable. Moderate stool throughout the colon. No evidence of bowel obstruction or inflammation. Normal appendix. The abdominal aorta and IVC appear unremarkable on this noncontrast study. No portal venous gas identified. There is no adenopathy. The abdominal wall soft tissues appear unremarkable. The osseous structures are intact. IMPRESSION: A 3 mm recently passed left renal calculus versus a left UVJ stone. No hydronephrosis. Electronically Signed   By: Anner Crete M.D.   On: 05/27/2015 02:36    I have personally reviewed and evaluated these images and lab results as part of my medical decision-making.   EKG Interpretation None      MDM   Final diagnoses:  LLQ abdominal pain  Kidney stone    50 y.o. male presents with sudden onset left flank and LLQ pain starting today. Urine with RBCs. Highly suspicious for nephrolithiasis, CT ordered as Pt has no history of same. Currently pain will controlled, appears to have passed 84mm stone without signs of obstruction. Patient needs to establish primary care in the area and was provided contact information to do so. Provided information for prn urology follow up.   I personally performed the services described in this documentation, which was scribed in my presence. The recorded information has been reviewed and is accurate.      Leo Grosser, MD 05/27/15 [phone]

## 2015-05-27 NOTE — ED Notes (Signed)
Provider wants to hold off on IV

## 2017-04-19 IMAGING — CT CT RENAL STONE PROTOCOL
2 of 3 series · 16 of 42 positions shown, 18 images · non-contrast
Comparison: Abdominal radiograph dated 06/15/2014

CLINICAL DATA: 49-year-old male with left lower quadrant abdominal
pain

EXAM:
CT ABDOMEN AND PELVIS WITHOUT CONTRAST
TECHNIQUE: Multidetector CT imaging of the abdomen and pelvis was performed
following the standard protocol without IV contrast.

[Series 4: lung · axial · 0.69mm/px · z∈[-142,-22]mm · 13 of 28 slices shown, 15 images]
[im 3/28  soft-tissue]
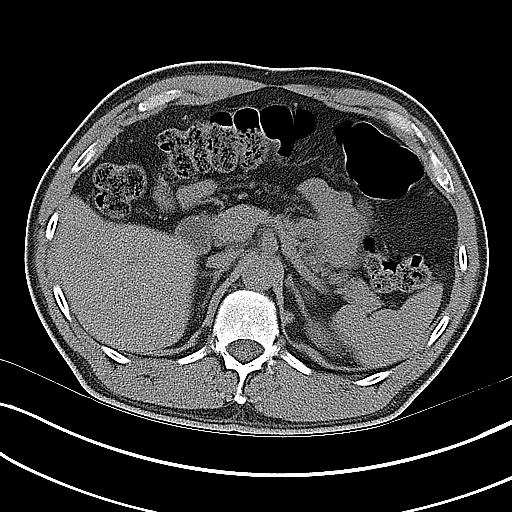
[im 3/28  bone]
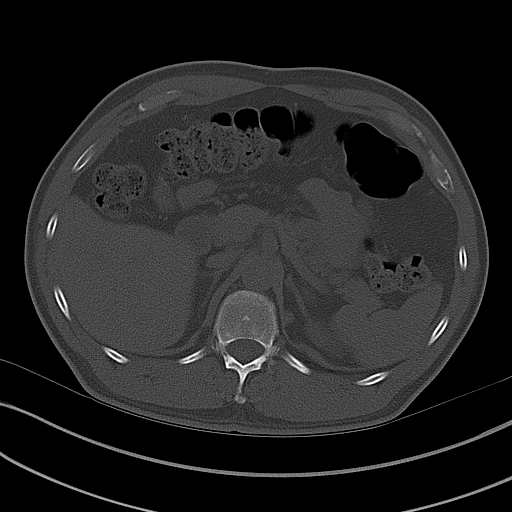
[im 5/28  soft-tissue]
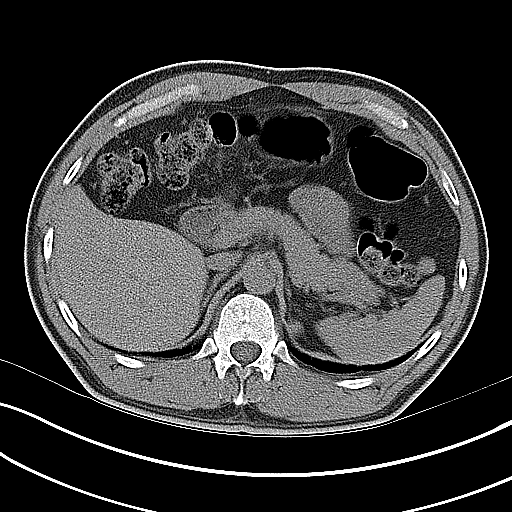
[im 7/28  soft-tissue]
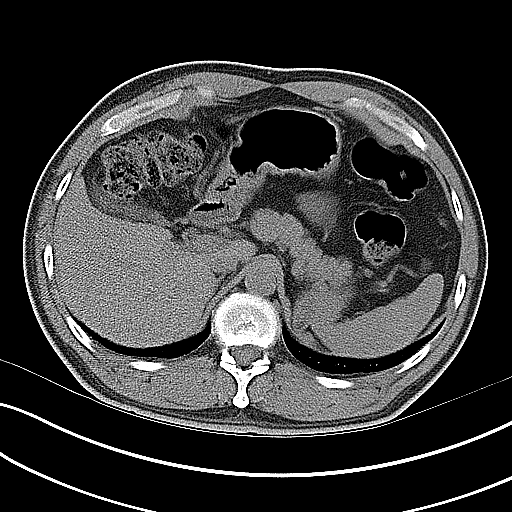
[im 9/28  soft-tissue]
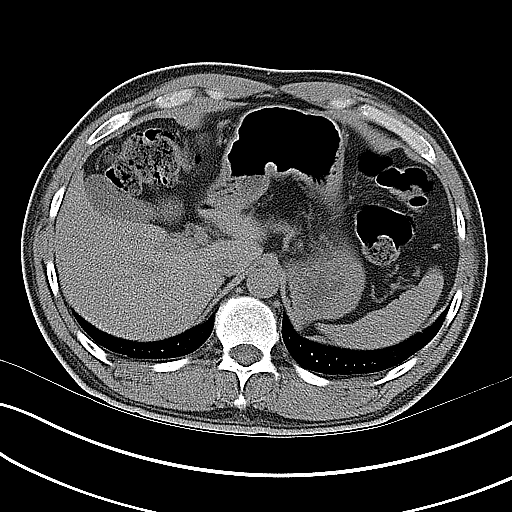
[im 11/28  soft-tissue]
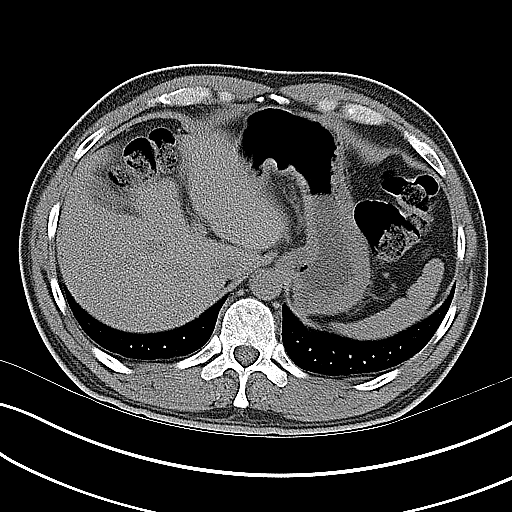
[im 13/28  soft-tissue]
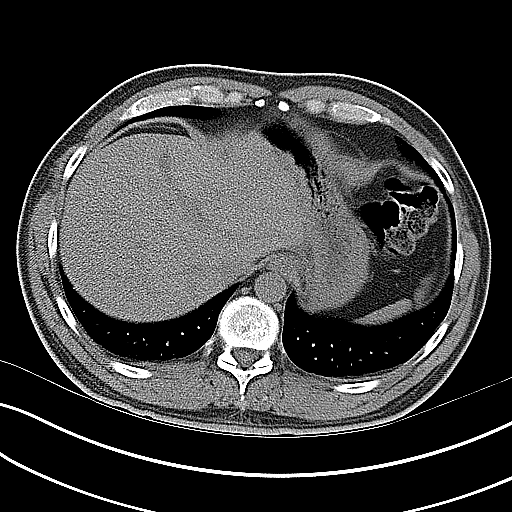
[im 15/28  soft-tissue]
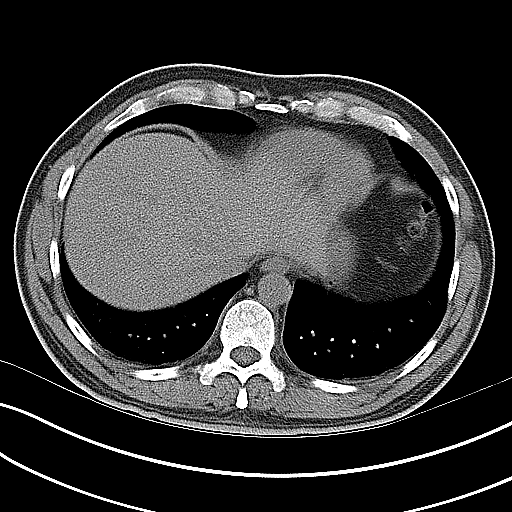
[im 17/28  soft-tissue]
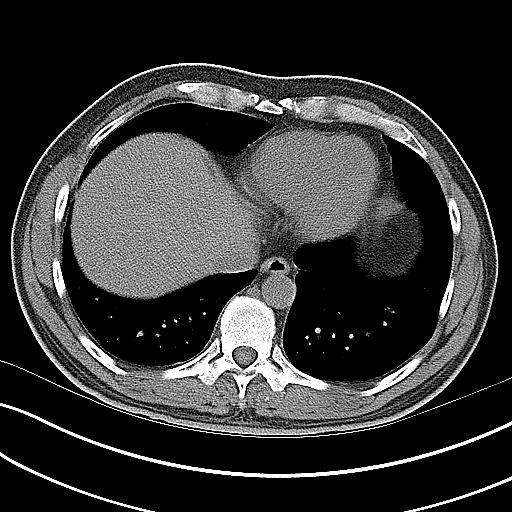
[im 19/28  soft-tissue]
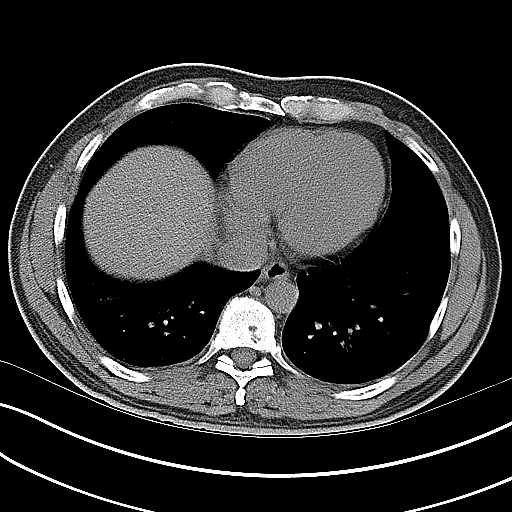
[im 19/28  bone]
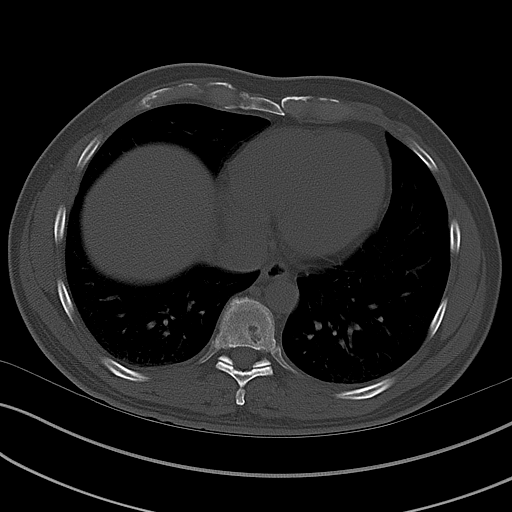
[im 21/28  soft-tissue]
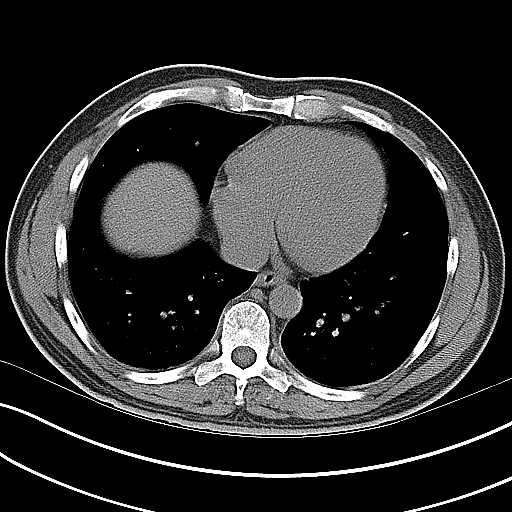
[im 23/28  soft-tissue]
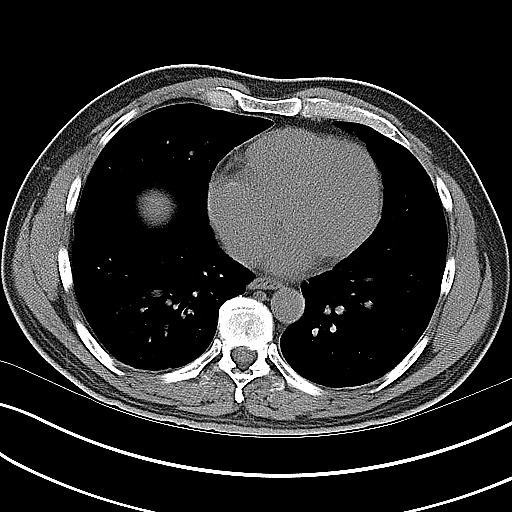
[im 25/28  soft-tissue]
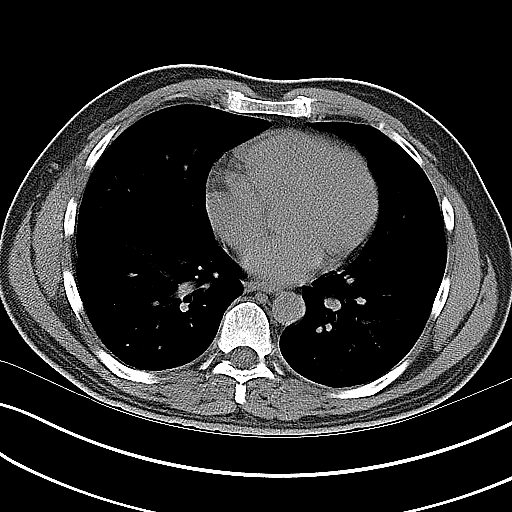
[im 27/28  soft-tissue]
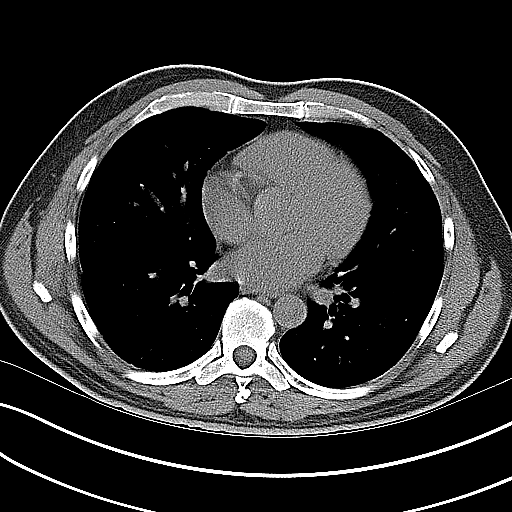

[Series 5: coronal · coronal · 0.74mm/px · 3 of 83 slices shown]
[im 28/83  soft-tissue]
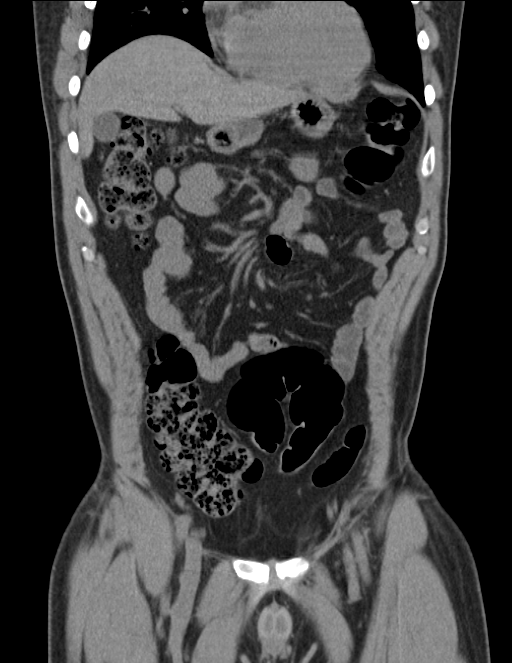
[im 37/83  soft-tissue]
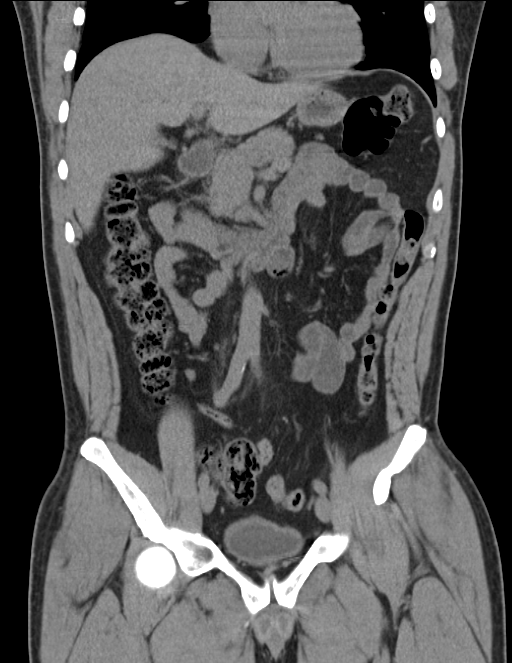
[im 46/83  soft-tissue]
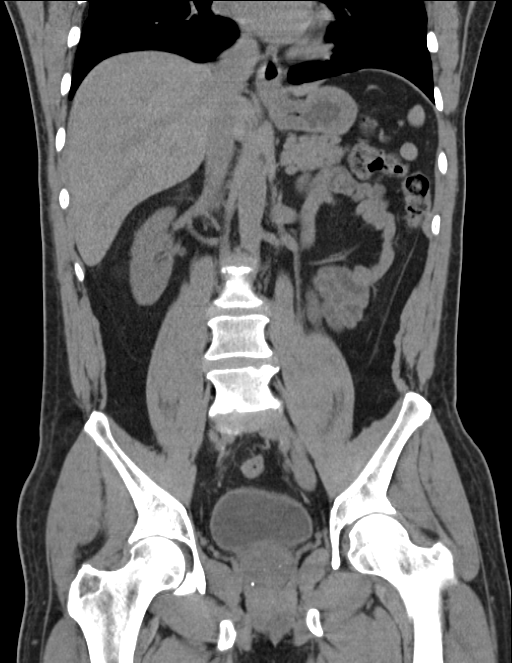

[16 of 42 positions shown; findings below may reference images not displayed]

FINDINGS: Evaluation of this exam is limited in the absence of intravenous
contrast.

The visualized lung bases are clear. No intra-abdominal free air or
free fluid.

The liver, gallbladder, pancreas, spleen, adrenal glands, kidneys,
visualized ureters appear unremarkable. There is a 3 mm stone along
the posterior wall of the urinary bladder adjacent to the left UVJ
which may represent a recently passed left renal calculus versus a
left UVJ stone. The prostate and seminal vesicles are grossly
unremarkable.

Moderate stool throughout the colon. No evidence of bowel
obstruction or inflammation. Normal appendix.

The abdominal aorta and IVC appear unremarkable on this noncontrast
study. No portal venous gas identified. There is no adenopathy. The
abdominal wall soft tissues appear unremarkable. The osseous
structures are intact.
IMPRESSION: A 3 mm recently passed left renal calculus versus a left UVJ stone.
No hydronephrosis.

## 2017-12-27 ENCOUNTER — Other Ambulatory Visit: Payer: Self-pay

## 2017-12-27 ENCOUNTER — Encounter: Payer: Self-pay | Admitting: Family Medicine

## 2017-12-27 ENCOUNTER — Ambulatory Visit: Payer: BLUE CROSS/BLUE SHIELD | Admitting: Family Medicine

## 2017-12-27 VITALS — BP 126/79 | HR 67 | Temp 99.2°F | Ht 66.73 in | Wt 168.2 lb

## 2017-12-27 DIAGNOSIS — E785 Hyperlipidemia, unspecified: Secondary | ICD-10-CM

## 2017-12-27 DIAGNOSIS — R5383 Other fatigue: Secondary | ICD-10-CM | POA: Diagnosis not present

## 2017-12-27 DIAGNOSIS — M62830 Muscle spasm of back: Secondary | ICD-10-CM

## 2017-12-27 DIAGNOSIS — Z833 Family history of diabetes mellitus: Secondary | ICD-10-CM

## 2017-12-27 DIAGNOSIS — R42 Dizziness and giddiness: Secondary | ICD-10-CM

## 2017-12-27 DIAGNOSIS — R202 Paresthesia of skin: Secondary | ICD-10-CM

## 2017-12-27 LAB — GLUCOSE, POCT (MANUAL RESULT ENTRY): POC Glucose: 90 mg/dl (ref 70–99)

## 2017-12-27 LAB — POCT GLYCOSYLATED HEMOGLOBIN (HGB A1C): Hemoglobin A1C: 5.3 % (ref 4.0–5.6)

## 2017-12-27 MED ORDER — CYCLOBENZAPRINE HCL 10 MG PO TABS
10.0000 mg | ORAL_TABLET | Freq: Three times a day (TID) | ORAL | 0 refills | Status: AC | PRN
Start: 2017-12-27 — End: ?

## 2017-12-27 NOTE — Patient Instructions (Addendum)
Drink lots of water every day  We will let you know the results of your laboratory tests in a few days sometime next week.  If you do not get feeling better over the next 2 weeks and can continue to have a lot of dizziness please return  The muscle relaxants can be used when you have muscle spasms in your back.  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  It is best to take the muscle relaxant at bedtime.    If you have lab work done today you will be contacted with your lab results within the next 2 weeks.  If you have not heard from [place] then please contact us. The fastest way to get your results is to register for My Chart.   IF you received an x-ray today, you will receive an invoice from [facility]. Please contact [facility] at [phone] with questions or concerns regarding your invoice.   IF you received labwork today, you will receive an invoice from [place]. Please contact [facility] at [phone] with questions or concerns regarding your invoice.   Our billing staff will not be able to assist you with questions regarding bills from these companies.  You will be contacted with the lab results as soon as they are available. The fastest way to get your results is to activate your My Chart account. Instructions are located on the last page of this paperwork. If you have not heard from [place] regarding the results in 2 weeks, please contact this office.

## 2017-12-27 NOTE — Progress Notes (Signed)
Patient ID: Blue Winther, male    DOB: [birth year]  Age: 52 y.o. MRN: [id number]  Chief Complaint  Patient presents with  . Fatigue    Hx of Diabetes on mother's side of family. Feels weal and tired all the time. Feels pain in the bottom of both feet. Feels nauseous afting meals    Subjective:   Patient presents with history of having problems with dizziness over the last week to.  He does not have a lot of headache, more a lightheaded sensation.  He has weakness and numbness in his legs from his feet down.  He also has a family history of diabetes in his mother.  He generally is pretty healthy most of the time otherwise.  He does have problems with back spasms and is back from time to time and likes to have the cyclobenzaprine as needed use.  He had an elevated cholesterol a couple of years ago and would like to have that rechecked.  Patient is a smoker  He works doing Biochemist, clinical at [employer], not drink huge amount of fluids.  He only urinates at bedtime and again when he gets up in the morning.  Current allergies, medications, problem list, past/family and social histories reviewed.  Objective:  BP 126/79 (BP Location: Left Arm, Patient Position: Sitting, Cuff Size: Normal)   Pulse 67   Temp 99.2 F (37.3 C) (Oral)   Ht 5' 6.73" (1.695 m)   Wt 168 lb 3.2 oz (76.3 kg)   SpO2 98%   BMI 26.56 kg/m   No major acute distress.  Alert and oriented.  TMs normal.  Eyes PRL.  Fundi benign.  EOMs intact.  Throat clear.  Neck supple without nodes without bruit.  No thyromegaly.  Chest clear to auscultation.  Heart regular without murmurs.  Abdomen soft without mass tenderness.  Straight leg raise test negative.  Sensory grossly normal.  Finger-nose normal.  Romberg negative.  Cranial nerves II to XII grossly intact.  Assessment & Plan:   Assessment: 1. Fatigue, unspecified type   2. Paresthesia   3. Family history of diabetes mellitus   4. Hyperlipidemia,  unspecified hyperlipidemia type   5. Lightheadedness       Plan: Normal examination and normal sugar studies.  Urged him to drink more fluids as I think it is a critical thing.  Urged him to quit smoking. Results for orders placed or performed in visit on 12/27/17  POCT glycosylated hemoglobin (Hb A1C)  Result Value Ref Range   Hemoglobin A1C 5.3 4.0 - 5.6 %   HbA1c POC (<> result, manual entry)     HbA1c, POC (prediabetic range)     HbA1c, POC (controlled diabetic range)    POCT glucose (manual entry)  Result Value Ref Range   POC Glucose 90 70 - 99 mg/dl    Orders Placed This Encounter  Procedures  . CMP14+EGFR  . POCT glycosylated hemoglobin (Hb A1C)  . POCT glucose (manual entry)    No orders of the defined types were placed in this encounter.        Patient Instructions   Drink lots of water every day  We will let you know the results of your laboratory tests in a few days sometime next week.  If you do not get feeling better over the next 2 weeks and can continue to have a lot of dizziness please return  The muscle relaxants can be used when you have muscle spasms in your  back.  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  It is best to take the muscle relaxant at bedtime.    If you have lab work done today you will be contacted with your lab results within the next 2 weeks.  If you have not heard from [place] then please contact us. The fastest way to get your results is to register for My Chart.   IF you received an x-ray today, you will receive an invoice from [facility]. Please contact [facility] at [phone] with questions or concerns regarding your invoice.   IF you received labwork today, you will receive an invoice from [place]. Please contact [facility] at [phone] with questions or concerns regarding your invoice.   Our billing staff will not be able to assist  you with questions regarding bills from these companies.  You will be contacted with the lab results as soon as they are available. The fastest way to get your results is to activate your My Chart account. Instructions are located on the last page of this paperwork. If you have not heard from [place] regarding the results in 2 weeks, please contact this office.        Return if symptoms worsen or fail to improve.   Ruben Reason, MD 12/27/2017

## 2017-12-28 LAB — CMP14+EGFR
ALT: 14 IU/L (ref 0–44)
AST: 23 IU/L (ref 0–40)
Albumin/Globulin Ratio: 2.1 (ref 1.2–2.2)
Albumin: 4.4 g/dL (ref 3.5–5.5)
Alkaline Phosphatase: 62 IU/L (ref 39–117)
BUN/Creatinine Ratio: 13 (ref 9–20)
BUN: 15 mg/dL (ref 6–24)
Bilirubin Total: 0.5 mg/dL (ref 0.0–1.2)
CO2: 26 mmol/L (ref 20–29)
Calcium: 9.4 mg/dL (ref 8.7–10.2)
Chloride: 102 mmol/L (ref 96–106)
Creatinine, Ser: 1.14 mg/dL (ref 0.76–1.27)
GFR calc Af Amer: 84 mL/min/{1.73_m2} (ref >59)
GFR calc non Af Amer: 73 mL/min/{1.73_m2} (ref >59)
Globulin, Total: 2.1 g/dL (ref 1.5–4.5)
Glucose: 108 mg/dL — ABNORMAL HIGH (ref 65–99)
Potassium: 4.3 mmol/L (ref 3.5–5.2)
Sodium: 141 mmol/L (ref 134–144)
Total Protein: 6.5 g/dL (ref 6.0–8.5)

## 2018-01-05 ENCOUNTER — Telehealth: Payer: Self-pay | Admitting: General Practice

## 2018-01-05 NOTE — Telephone Encounter (Signed)
Copied from [place] [id number]. Topic: Quick Communication - Lab Results >> Jan 05, 2018  9:21 AM Charlynn Court wrote:  pt wants call on lab results

## 2018-01-06 ENCOUNTER — Encounter: Payer: Self-pay | Admitting: *Deleted

## 2018-01-06 NOTE — Telephone Encounter (Signed)
Please let patient know that her labs were normal except with glucose which is consistent with pre-diabetes and that they are on my chart patient portal for her to see.  Thanks I Pamella Pert, MD

## 2018-01-06 NOTE — Telephone Encounter (Signed)
Letter sent Home number disconnected Unable to reach at work number.

## 2018-01-07 ENCOUNTER — Telehealth: Payer: Self-pay | Admitting: *Deleted

## 2018-01-07 NOTE — Telephone Encounter (Signed)
Informed patient of lab results

## 2018-01-08 ENCOUNTER — Encounter: Payer: Self-pay | Admitting: Family Medicine

## 2019-11-18 ENCOUNTER — Other Ambulatory Visit: Payer: Self-pay

## 2019-11-18 ENCOUNTER — Ambulatory Visit (HOSPITAL_COMMUNITY)
Admission: EM | Admit: 2019-11-18 | Discharge: 2019-11-18 | Disposition: A | Discharge status: Home / Self Care | Payer: 59

## 2019-11-18 ENCOUNTER — Encounter (HOSPITAL_COMMUNITY): Payer: Self-pay

## 2019-11-18 DIAGNOSIS — F17200 Nicotine dependence, unspecified, uncomplicated: Secondary | ICD-10-CM

## 2019-11-18 DIAGNOSIS — K5909 Other constipation: Secondary | ICD-10-CM

## 2019-11-18 DIAGNOSIS — R140 Abdominal distension (gaseous): Secondary | ICD-10-CM

## 2019-11-18 DIAGNOSIS — K5900 Constipation, unspecified: Secondary | ICD-10-CM

## 2019-11-18 LAB — CBG MONITORING, ED: Glucose-Capillary: 92 mg/dL (ref 70–99)

## 2019-11-18 MED ORDER — DOCUSATE SODIUM 100 MG PO CAPS: 100.0000 mg | ORAL_CAPSULE | Freq: Two times a day (BID) | ORAL | 0 refills | Status: AC

## 2019-11-18 NOTE — ED Triage Notes (Signed)
Pt reports becoming bloated after eating. Reports he has been feeling bloated for 2 weeks.  "I have been dx with gastritis and I am not sure if this has something to do with that". Pt has not seen PCP about the bloating.

## 2019-11-18 NOTE — ED Provider Notes (Signed)
[place]   MRN: [id number] DOB: 1965-06-22  Subjective:   Russell Hunt is a 54 y.o. male presenting for several yr hx of abdominal bloating, constipation, burping. Has previously had labs, imaging. Wants a complete PE. Denies bloody stools.   No current facility-administered medications for this encounter.  Current Outpatient Medications:  .  esomeprazole (NEXIUM) 20 MG capsule, Take 20 mg by mouth daily at 12 noon., Disp: , Rfl:  .  cyclobenzaprine (FLEXERIL) 10 MG tablet, Take 1 tablet (10 mg total) by mouth 3 (three) times daily as needed. for muscle spams, Disp: 30 tablet, Rfl: 0 .  HYDROcodone-acetaminophen (NORCO/VICODIN) 5-325 MG tablet, Take 1 tablet by mouth every 6 (six) hours as needed for moderate pain. (Patient not taking: Reported on 12/27/2017), Disp: 6 tablet, Rfl: 0 .  ibuprofen (ADVIL,MOTRIN) 200 MG tablet, Take 200 mg by mouth every 6 (six) hours as needed for mild pain., Disp: , Rfl:  .  meloxicam (MOBIC) 7.5 MG tablet, Take 1 tablet (7.5 mg total) by mouth daily. (Patient not taking: Reported on 05/27/2015), Disp: 30 tablet, Rfl: 0   No Known Allergies  Past Medical History:  Diagnosis Date  . Allergy      Past Surgical History:  Procedure Laterality Date  . HERNIA REPAIR      Family History  Problem Relation Age of Onset  . Diabetes Mother     Social History   Tobacco Use  . Smoking status: Current Every Day Smoker    Packs/day: 0.50    Years: 30.00    Pack years: 15.00    Types: Cigarettes  . Smokeless tobacco: Never Used  Substance Use Topics  . Alcohol use: Yes  . Drug use: No    ROS   Objective:   Vitals: BP 117/80 (BP Location: Right Arm)   Pulse (!) 58   Temp 97.9 F (36.6 C) (Oral)   Resp 16   SpO2 97%   Physical Exam Constitutional:      General: He is not in acute distress.    Appearance: Normal appearance. He is well-developed and normal weight. He is not ill-appearing, toxic-appearing or diaphoretic.    HENT:     Head: Normocephalic and atraumatic.     Right Ear: External ear normal.     Left Ear: External ear normal.     Nose: Nose normal.     Mouth/Throat:     Pharynx: Oropharynx is clear.  Eyes:     General: No scleral icterus.       Right eye: No discharge.        Left eye: No discharge.     Extraocular Movements: Extraocular movements intact.     Pupils: Pupils are equal, round, and reactive to light.  Cardiovascular:     Rate and Rhythm: Normal rate and regular rhythm.     Heart sounds: No murmur heard.  No friction rub. No gallop.   Pulmonary:     Effort: Pulmonary effort is normal. No respiratory distress.     Breath sounds: No wheezing or rales.  Abdominal:     General: Bowel sounds are normal. There is no distension.     Palpations: Abdomen is soft. There is no mass.     Tenderness: There is no abdominal tenderness. There is no guarding or rebound.  Musculoskeletal:     Cervical back: Normal range of motion.  Skin:    General: Skin is warm and dry.  Neurological:     Mental Status:  He is alert and oriented to person, place, and time.  Psychiatric:        Mood and Affect: Mood normal.        Behavior: Behavior normal.        Thought Content: Thought content normal.        Judgment: Judgment normal.     Results for orders placed or performed during the hospital encounter of 11/18/19 (from the past 24 hour(s))  POC CBG monitoring     Status: None   Collection Time: 11/18/19  1:20 PM  Result Value Ref Range   Glucose-Capillary 92 70 - 99 mg/dL   Assessment and Plan :   PDMP not reviewed this encounter.  1. Abdominal bloating   2. Constipation, unspecified constipation type   3. Smoker   4. Chronic constipation     Advised need for dietary modifications. Start docusate. Provided with info to see PCP for annual PE, check on prostate, CT scan, referral to GI for colonoscopy. Counseled patient on potential for adverse effects with medications  prescribed/recommended today, ER and return-to-clinic precautions discussed, patient verbalized understanding.    [name], PA-C 11/19/19 1300

## 2019-11-18 NOTE — Discharge Instructions (Addendum)
Por favor haga una cita con [facility] para un examen fisico, un chequeo de la prostata y el colon. Seria muy importante con respeto a su estrenimiento.

## 2020-04-12 NOTE — Progress Notes (Signed)
Records from referral - Russell Hunt 02-09-20

## 2020-04-18 ENCOUNTER — Encounter (HOSPITAL_COMMUNITY): Payer: Self-pay | Admitting: *Deleted

## 2020-04-18 ENCOUNTER — Other Ambulatory Visit: Payer: Self-pay

## 2020-04-18 ENCOUNTER — Ambulatory Visit (HOSPITAL_COMMUNITY)
Admission: EM | Admit: 2020-04-18 | Discharge: 2020-04-18 | Disposition: A | Discharge status: Home / Self Care | Payer: 59 | Attending: Urgent Care | Admitting: Urgent Care

## 2020-04-18 DIAGNOSIS — Z79899 Other long term (current) drug therapy: Secondary | ICD-10-CM | POA: Diagnosis not present

## 2020-04-18 DIAGNOSIS — B349 Viral infection, unspecified: Secondary | ICD-10-CM

## 2020-04-18 DIAGNOSIS — F17210 Nicotine dependence, cigarettes, uncomplicated: Secondary | ICD-10-CM | POA: Insufficient documentation

## 2020-04-18 DIAGNOSIS — R52 Pain, unspecified: Secondary | ICD-10-CM

## 2020-04-18 DIAGNOSIS — Z791 Long term (current) use of non-steroidal anti-inflammatories (NSAID): Secondary | ICD-10-CM | POA: Diagnosis not present

## 2020-04-18 DIAGNOSIS — U071 COVID-19: Secondary | ICD-10-CM | POA: Diagnosis not present

## 2020-04-18 DIAGNOSIS — R509 Fever, unspecified: Secondary | ICD-10-CM

## 2020-04-18 LAB — SARS CORONAVIRUS 2 (TAT 6-24 HRS): SARS Coronavirus 2: POSITIVE — AB

## 2020-04-18 MED ORDER — NAPROXEN 500 MG PO TABS: 500.0000 mg | ORAL_TABLET | Freq: Two times a day (BID) | ORAL | 0 refills | Status: AC

## 2020-04-18 NOTE — Discharge Instructions (Signed)
Para el dolor de garganta o tos puede usar un t de miel. Use 3 cucharaditas de miel con jugo exprimido de [employer]. Coloque trozos de Pension scheme manager en 1/2-1 taza de agua y caliente sobre la estufa. Luego mezcle los ingredientes y repita cada 4 horas. Para fiebre, dolores de cuerpo tome naproxena 500mg  con comida cada 12 horas alternando con o junto con Tylenol 500mg -650mg  cada 6 horas. Hidrata muy bien con al menos 2 litros (64 onzas) de agua al dia. Coma comidas ligeras como sopas para [employer] y nutricion. Tambien puede tomar suero. Comience un antihistamnico como Zyrtec (cetirizina) 10mg  al dia. Puede usar pseudoefedrina (Sudafed) de venta libre para el goteo posnasal, congestin a una dosis de 60 mg cada 8 horas o cada 12 horas. Use el jarabe por la noche para su tos y las capsulas durante el dia.

## 2020-04-18 NOTE — ED Triage Notes (Signed)
PT reports for the last 5-6 days he has had a fever and generalised body pain the . [facility]  [facility]. The results were Neg.  for  COVID.

## 2020-04-18 NOTE — ED Provider Notes (Signed)
[facility]   MRN: [id number] DOB: [birth year]  Subjective:   Russell Hunt is a 54 y.o. male presenting for 5 to 6-day history of persistent body aches, subjective fever.  Patient went to a different facility yesterday and got a rapid Covid test which was negative.  Denies runny or stuffy nose, sore throat, cough, chest pain, shortness of breath, loss of sense of taste and smell, belly pain, dysuria, urinary frequency, rashes.  Patient did get his Covid vaccination, he did the [employer] in April of this past year.  Has not gotten her booster.  No current facility-administered medications for this encounter.  Current Outpatient Medications:  .  docusate sodium (COLACE) 100 MG capsule, Take 1 capsule (100 mg total) by mouth every 12 (twelve) hours., Disp: 60 capsule, Rfl: 0 .  Simethicone 125 MG CAPS, Take 1 capsule by mouth 2 (two) times daily., Disp: , Rfl:  .  cyclobenzaprine (FLEXERIL) 10 MG tablet, Take 1 tablet (10 mg total) by mouth 3 (three) times daily as needed. for muscle spams, Disp: 30 tablet, Rfl: 0 .  esomeprazole (NEXIUM) 20 MG capsule, Take 20 mg by mouth daily at 12 noon., Disp: , Rfl:  .  famotidine (PEPCID) 20 MG tablet, Take 20 mg by mouth 2 (two) times daily., Disp: , Rfl:  .  HYDROcodone-acetaminophen (NORCO/VICODIN) 5-325 MG tablet, Take 1 tablet by mouth every 6 (six) hours as needed for moderate pain. (Patient not taking: Reported on 12/27/2017), Disp: 6 tablet, Rfl: 0 .  ibuprofen (ADVIL,MOTRIN) 200 MG tablet, Take 200 mg by mouth every 6 (six) hours as needed for mild pain., Disp: , Rfl:  .  meloxicam (MOBIC) 7.5 MG tablet, Take 1 tablet (7.5 mg total) by mouth daily. (Patient not taking: Reported on 05/27/2015), Disp: 30 tablet, Rfl: 0 .  pantoprazole (PROTONIX) 40 MG tablet, Take 40 mg by mouth daily., Disp: , Rfl:    No Known Allergies  Past Medical History:  Diagnosis Date  . Allergy      Past Surgical History:  Procedure  Laterality Date  . HERNIA REPAIR      Family History  Problem Relation Age of Onset  . Diabetes Mother     Social History   Tobacco Use  . Smoking status: Current Every Day Smoker    Packs/day: 0.50    Years: 30.00    Pack years: 15.00    Types: Cigarettes  . Smokeless tobacco: Never Used  Substance Use Topics  . Alcohol use: Yes  . Drug use: No    ROS   Objective:   Vitals: BP (!) 141/86 (BP Location: Left Arm)   Pulse 76   Temp 98.4 F (36.9 C) (Oral)   Resp 18   SpO2 99%   Physical Exam Constitutional:      General: He is not in acute distress.    Appearance: Normal appearance. He is well-developed. He is ill-appearing. He is not toxic-appearing or diaphoretic.  HENT:     Head: Normocephalic and atraumatic.     Right Ear: External ear normal.     Left Ear: External ear normal.     Nose: Nose normal.     Mouth/Throat:     Mouth: Mucous membranes are moist.     Pharynx: Oropharynx is clear.  Eyes:     General: No scleral icterus.       Right eye: No discharge.        Left eye: No discharge.  Extraocular Movements: Extraocular movements intact.     Conjunctiva/sclera: Conjunctivae normal.     Pupils: Pupils are equal, round, and reactive to light.  Cardiovascular:     Rate and Rhythm: Normal rate and regular rhythm.     Heart sounds: Normal heart sounds. No murmur heard.  No friction rub. No gallop.   Pulmonary:     Effort: Pulmonary effort is normal. No respiratory distress.     Breath sounds: Normal breath sounds. No stridor. No wheezing, rhonchi or rales.  Skin:    General: Skin is warm and dry.  Neurological:     Mental Status: He is alert and oriented to person, place, and time.  Psychiatric:        Mood and Affect: Mood normal.        Behavior: Behavior normal.        Thought Content: Thought content normal.        Judgment: Judgment normal.      Assessment and Plan :   PDMP not reviewed this encounter.  1. Viral syndrome   2.  Body aches   3. Fever, unspecified     Patient is very well-appearing, suspect influenza versus general viral syndrome versus COVID-19.  Counseled against flu testing, repeat a COVID-19 test.  Recommended supportive care otherwise. Counseled patient on potential for adverse effects with medications prescribed/recommended today, ER and return-to-clinic precautions discussed, patient verbalized understanding.    [name], [place] 04/18/20 [phone]

## 2020-04-20 ENCOUNTER — Telehealth: Payer: Self-pay | Admitting: Nurse Practitioner

## 2020-04-20 ENCOUNTER — Ambulatory Visit: Payer: 59 | Admitting: Gastroenterology

## 2020-04-20 NOTE — Telephone Encounter (Signed)
Called to Discuss with patient about Covid symptoms and the use of the monoclonal antibody infusion for those with mild to moderate Covid symptoms and at a high risk of hospitalization.     Will need to speak further with patient to see if he would qualify for this infusion due to co-morbid conditions and/or a member of an at-risk group in accordance with the FDA Emergency Use Authorization.  On chart review no co-morbid conditions. BMI 30. Would need to speak with patient further to determine if he meets criteria for infusion.   Unable to reach with use of interpretor. Voiceamail left.    Alda Lea, NP [facility]  [phone]

## 2020-05-04 ENCOUNTER — Other Ambulatory Visit: Payer: Self-pay

## 2020-05-04 ENCOUNTER — Encounter: Payer: Self-pay | Admitting: Emergency Medicine

## 2020-05-04 ENCOUNTER — Ambulatory Visit: Payer: 59 | Admitting: Emergency Medicine

## 2020-05-04 VITALS — BP 114/76 | HR 67 | Temp 98.8°F | Resp 16 | Ht 66.75 in | Wt 181.0 lb

## 2020-05-04 DIAGNOSIS — Z125 Encounter for screening for malignant neoplasm of prostate: Secondary | ICD-10-CM | POA: Diagnosis not present

## 2020-05-04 DIAGNOSIS — U071 COVID-19: Secondary | ICD-10-CM

## 2020-05-04 DIAGNOSIS — G933 Postviral fatigue syndrome: Secondary | ICD-10-CM | POA: Diagnosis not present

## 2020-05-04 DIAGNOSIS — G9331 Postviral fatigue syndrome: Secondary | ICD-10-CM

## 2020-05-04 DIAGNOSIS — F17200 Nicotine dependence, unspecified, uncomplicated: Secondary | ICD-10-CM

## 2020-05-04 NOTE — Progress Notes (Signed)
Russell Hunt 54 y.o.   Chief Complaint  Patient presents with  . Hospitalization Follow-up    Patient went to the Urgent Care at [facility] and dx with Covid 19 virus    HISTORY OF PRESENT ILLNESS: This is a 54 y.o. male developed flulike symptoms in late November.  Was seen at urgent care center on 04/18/2020.  Tested positive for Covid.  Here for follow-up.  Improving and much better but still has loss of smell and taste.  No other significant symptoms.  HPI   Prior to Admission medications   Medication Sig Start Date End Date Taking? Authorizing Provider  cyclobenzaprine (FLEXERIL) 10 MG tablet Take 1 tablet (10 mg total) by mouth 3 (three) times daily as needed. for muscle spams Patient not taking: Reported on 05/04/2020 12/27/17   Posey Boyer, MD  docusate sodium (COLACE) 100 MG capsule Take 1 capsule (100 mg total) by mouth every 12 (twelve) hours. Patient not taking: Reported on 05/04/2020 11/18/19   [name], PA-C  esomeprazole (NEXIUM) 20 MG capsule Take 20 mg by mouth daily at 12 noon. Patient not taking: Reported on 05/04/2020    [provider]  famotidine (PEPCID) 20 MG tablet Take 20 mg by mouth 2 (two) times daily. Patient not taking: Reported on 05/04/2020    [provider]  HYDROcodone-acetaminophen (NORCO/VICODIN) 5-325 MG tablet Take 1 tablet by mouth every 6 (six) hours as needed for moderate pain. Patient not taking: Reported on 05/04/2020 05/27/15   Leo Grosser, MD  ibuprofen (ADVIL,MOTRIN) 200 MG tablet Take 200 mg by mouth every 6 (six) hours as needed for mild pain. Patient not taking: Reported on 05/04/2020    [provider]  meloxicam (MOBIC) 7.5 MG tablet Take 1 tablet (7.5 mg total) by mouth daily. Patient not taking: Reported on 05/04/2020 06/15/14   [name], PA-C  naproxen (NAPROSYN) 500 MG tablet Take 1 tablet (500 mg total) by mouth 2 (two) times daily with a meal. Patient not taking: Reported on 05/04/2020 04/18/20    [name], PA-C  pantoprazole (PROTONIX) 40 MG tablet Take 40 mg by mouth daily. Patient not taking: Reported on 05/04/2020    [provider]  Simethicone 125 MG CAPS Take 1 capsule by mouth 2 (two) times daily. Patient not taking: Reported on 05/04/2020    [provider]    No Known Allergies  There are no problems to display for this patient.   Past Medical History:  Diagnosis Date  . Allergy     Past Surgical History:  Procedure Laterality Date  . HERNIA REPAIR      Social History   Socioeconomic History  . Marital status: Single    Spouse name: Not on file  . Number of children: Not on file  . Years of education: Not on file  . Highest education level: Not on file  Occupational History  . Not on file  Tobacco Use  . Smoking status: Current Every Day Smoker    Packs/day: 0.50    Years: 30.00    Pack years: 15.00    Types: Cigarettes  . Smokeless tobacco: Never Used  Substance and Sexual Activity  . Alcohol use: Yes  . Drug use: No  . Sexual activity: Not on file  Other Topics Concern  . Not on file  Social History Narrative  . Not on file   Social Determinants of Health   Financial Resource Strain: Not on file  Food Insecurity: Not on file  Transportation Needs: Not on file  Physical Activity: Not on file  Stress: Not on file  Social Connections: Not on file  Intimate Partner Violence: Not on file    Family History  Problem Relation Age of Onset  . Diabetes Mother      Review of Systems  Constitutional: Negative.  Negative for chills and fever.  HENT: Negative.  Negative for congestion and sore throat.   Respiratory: Negative.  Negative for cough and shortness of breath.   Cardiovascular: Negative.  Negative for chest pain and palpitations.  Gastrointestinal: Negative.  Negative for abdominal pain, diarrhea, nausea and vomiting.  Genitourinary: Negative.  Negative for dysuria and hematuria.  Skin: Negative.  Negative  for rash.  Neurological: Negative.  Negative for dizziness and headaches.  All other systems reviewed and are negative.    Physical Exam Vitals reviewed.  Constitutional:      Appearance: Normal appearance.  HENT:     Head: Normocephalic.  Eyes:     Extraocular Movements: Extraocular movements intact.     Conjunctiva/sclera: Conjunctivae normal.     Pupils: Pupils are equal, round, and reactive to light.  Cardiovascular:     Rate and Rhythm: Normal rate and regular rhythm.     Pulses: Normal pulses.     Heart sounds: Normal heart sounds.  Pulmonary:     Effort: Pulmonary effort is normal.     Breath sounds: Normal breath sounds.  Musculoskeletal:        General: Normal range of motion.     Cervical back: Normal range of motion and neck supple.  Skin:    General: Skin is warm and dry.     Capillary Refill: Capillary refill takes less than 2 seconds.  Neurological:     General: No focal deficit present.     Mental Status: He is alert and oriented to person, place, and time.  Psychiatric:        Mood and Affect: Mood normal.        Behavior: Behavior normal.      ASSESSMENT & PLAN: Russell Hunt was seen today for hospitalization follow-up.  Diagnoses and all orders for this visit:  COVID-19 virus infection -     SAR CoV2 Serology (COVID 19)AB(IGG)IA  Post viral syndrome -     Comprehensive metabolic panel -     Hemoglobin A1c -     CBC with Differential/Platelet  Current smoker  Prostate cancer screening -     PSA    Patient Instructions       If you have lab work done today you will be contacted with your lab results within the next 2 weeks.  If you have not heard from [place] then please contact us. The fastest way to get your results is to register for My Chart.   IF you received an x-ray today, you will receive an invoice from [facility]. Please contact [facility] at [phone] with questions or concerns regarding your invoice.   IF  you received labwork today, you will receive an invoice from [place]. Please contact [facility] at [phone] with questions or concerns regarding your invoice.   Our billing staff will not be able to assist you with questions regarding bills from these companies.  You will be contacted with the lab results as soon as they are available. The fastest way to get your results is to activate your My Chart account. Instructions are located on the last page of this paperwork. If you have not heard  from [place] regarding the results in 2 weeks, please contact this office.     Mantenimiento de Teacher, English as a foreign language, Male Adoptar un estilo de vida saludable y recibir atencin preventiva son importantes para promover la salud y Musician. Consulte al mdico sobre:  El esquema adecuado para hacerse pruebas y exmenes peridicos.  Cosas que puede hacer por su cuenta para prevenir enfermedades y [place] sano. Qu debo saber sobre la dieta, el peso y el ejercicio? Consuma una dieta saludable   Consuma una dieta que incluya muchas verduras, frutas, productos lcteos con bajo contenido de [place] y Advertising account planner.  No consuma muchos alimentos ricos en grasas slidas, azcares agregados o sodio. Mantenga un peso saludable El ndice de masa muscular [facility]) es una medida que puede utilizarse para identificar posibles problemas de [place]. Proporciona una estimacin de la grasa corporal basndose en el peso y la altura. Su mdico puede ayudarle a Radiation protection practitioner [place] y a Scientist, forensic o Theatre manager un peso saludable. Haga ejercicio con regularidad Haga ejercicio con regularidad. Esta es una de las prcticas ms importantes que puede hacer por su salud. La mayora de los adultos deben seguir estas pautas:  Optometrist, al menos, 152minutos de actividad fsica por semana. El ejercicio debe aumentar la frecuencia cardaca y Nature conservation officer transpirar (ejercicio de intensidad moderada).  Hacer ejercicios de  fortalecimiento por lo [employer] por semana. Agregue esto a su plan de ejercicio de intensidad moderada.  Pasar menos tiempo sentados. Incluso la actividad fsica ligera puede ser beneficiosa. Controle sus niveles de colesterol y lpidos en la sangre Comience a realizarse anlisis de lpidos y Research officer, trade union en la sangre a los 20aos y luego reptalos cada 5aos. Es posible que Automotive engineer los niveles de colesterol con mayor frecuencia si:  Sus niveles de lpidos y colesterol son altos.  Es mayor de 40aos.  Presenta un alto riesgo de padecer enfermedades cardacas. Qu debo saber sobre las pruebas de deteccin del cncer? Muchos tipos de cncer pueden detectarse de manera temprana y, a menudo, pueden prevenirse. Segn su historia clnica y sus antecedentes familiares, es posible que deba realizarse pruebas de deteccin del cncer en diferentes edades. Esto puede incluir pruebas de deteccin de lo siguiente:  Surveyor, minerals.  Cncer de prstata.  Cncer de piel.  Cncer de pulmn. Qu debo saber sobre la enfermedad cardaca, la diabetes y la hipertensin arterial? Presin arterial y enfermedad cardaca  La hipertensin arterial causa enfermedades cardacas y [place] el riesgo de accidente cerebrovascular. Es ms probable que esto se manifieste en las personas que tienen lecturas de presin arterial alta, tienen ascendencia africana o tienen sobrepeso.  Hable con el mdico sobre sus valores de presin arterial deseados.  Hgase controlar la presin arterial: ? Cada 3 a 5 aos si tiene entre 18 y 22 aos. ? Todos los aos si es mayor de [place].  Si tiene entre 65 y 65 aos y es fumador o Insurance account manager, pregntele al mdico si debe realizarse una prueba de deteccin de aneurisma artico abdominal (AAA) por nica vez. Diabetes Realcese exmenes de deteccin de la diabetes con regularidad. Este anlisis revisa el nivel de azcar en la sangre en [place]. Hgase las  pruebas de deteccin:  Cada tresaos despus de los 23aos de edad si tiene un peso normal y un bajo riesgo de padecer diabetes.  Con ms frecuencia y a partir de [place] edad inferior si tiene sobrepeso o un alto riesgo de padecer diabetes. Qu debo saber [employer]  prevencin de infecciones? Hepatitis B Si tiene un riesgo ms alto de contraer hepatitis B, debe someterse a un examen de deteccin de este virus. Hable con el mdico para averiguar si tiene riesgo de contraer la infeccin por hepatitis B. Hepatitis C Se recomienda un anlisis de [place] para:  Todos los que nacieron entre 1945 y [phone].  Todas las personas que tengan un riesgo de haber contrado hepatitis C. Enfermedades de transmisin sexual (ETS)  Debe realizarse pruebas de deteccin de ITS todos los aos, incluidas la gonorrea y la clamidia, si: ? Es sexualmente activo y es menor de 24aos. ? Es mayor de 24aos, y Investment banker, operational informa que corre riesgo de tener este tipo de infecciones. ? La actividad sexual ha cambiado desde que le hicieron la ltima prueba de deteccin y tiene un riesgo mayor de Best boy clamidia o Radio broadcast assistant. Pregntele al mdico si usted tiene riesgo.  Pregntele al mdico si usted tiene un alto riesgo de Museum/gallery curator VIH. El mdico tambin puede recomendarle un medicamento recetado para ayudar a evitar la infeccin por el VIH. Si elige tomar medicamentos para prevenir el VIH, primero debe [employer] de deteccin del VIH. Luego debe hacerse anlisis cada 55meses mientras est tomando los medicamentos. Siga estas instrucciones en su casa: Estilo de vida  No consuma ningn producto que contenga nicotina o tabaco, como cigarrillos, cigarrillos electrnicos y tabaco de Higher education careers adviser. Si necesita ayuda para dejar de fumar, consulte al mdico.  No consuma drogas.  No comparta agujas.  Solicite ayuda a su mdico si necesita apoyo o informacin para abandonar las drogas. Consumo de alcohol  No beba alcohol si el  mdico se lo prohbe.  Si bebe alcohol: ? Limite la cantidad que consume de 0 a 2 medidas por da. ? Est atento a la cantidad de alcohol que hay en las bebidas que toma. En los [place], una medida equivale a una botella de cerveza de 12oz (340ml), un vaso de vino de 5oz (182ml) o un vaso de una bebida alcohlica de alta graduacin de 1oz (81ml). Instrucciones generales  Realcese los estudios de rutina de la salud, dentales y de Public librarian.  [place].  Infrmele a su mdico si: ? Se siente deprimido con frecuencia. ? Alguna vez ha sido vctima de [place] o no se siente seguro en su casa. Resumen  Adoptar un estilo de vida saludable y recibir atencin preventiva son importantes para promover la salud y Musician.  Siga las instrucciones del mdico acerca de una dieta saludable, el ejercicio y la realizacin de pruebas o exmenes para Engineer, building services.  Siga las instrucciones del mdico con respecto al control del colesterol y la presin arterial. Esta informacin no tiene Marine scientist el consejo del mdico. Asegrese de hacerle al mdico cualquier pregunta que tenga. Document Revised: 05/20/2018 Document Reviewed: 05/20/2018 Elsevier Patient Education  2020 Elsevier Inc.       Agustina Caroli, MD [facility]

## 2020-05-04 NOTE — Patient Instructions (Addendum)
   If you have lab work done today you will be contacted with your lab results within the next 2 weeks.  If you have not heard from us then please contact us. The fastest way to get your results is to register for My Chart.   IF you received an x-ray today, you will receive an invoice from [facility]. Please contact [facility] at [phone] with questions or concerns regarding your invoice.   IF you received labwork today, you will receive an invoice from [facility]. Please contact [facility] at [phone] with questions or concerns regarding your invoice.   Our billing staff will not be able to assist you with questions regarding bills from these companies.  You will be contacted with the lab results as soon as they are available. The fastest way to get your results is to activate your My Chart account. Instructions are located on the last page of this paperwork. If you have not heard from us regarding the results in 2 weeks, please contact this office.       Mantenimiento de la salud en los hombres Health Maintenance, Male Adoptar un estilo de vida saludable y recibir atencin preventiva son importantes para promover la salud y el bienestar. Consulte al mdico sobre:  El esquema adecuado para hacerse pruebas y exmenes peridicos.  Cosas que puede hacer por su cuenta para prevenir enfermedades y mantenerse sano. Qu debo saber sobre la dieta, el peso y el ejercicio? Consuma una dieta saludable   Consuma una dieta que incluya muchas verduras, frutas, productos lcteos con bajo contenido de grasa y protenas magras.  No consuma muchos alimentos ricos en grasas slidas, azcares agregados o sodio. Mantenga un peso saludable El ndice de masa muscular (IMC) es una medida que puede utilizarse para identificar posibles problemas de peso. Proporciona una estimacin de la grasa corporal basndose en el peso y la altura. Su mdico puede ayudarle a determinar su IMC y  a lograr o mantener un peso saludable. Haga ejercicio con regularidad Haga ejercicio con regularidad. Esta es una de las prcticas ms importantes que puede hacer por su salud. La mayora de los adultos deben seguir estas pautas:  Realizar, al menos, 150minutos de actividad fsica por semana. El ejercicio debe aumentar la frecuencia cardaca y hacerlo transpirar (ejercicio de intensidad moderada).  Hacer ejercicios de fortalecimiento por lo menos dos veces por semana. Agregue esto a su plan de ejercicio de intensidad moderada.  Pasar menos tiempo sentados. Incluso la actividad fsica ligera puede ser beneficiosa. Controle sus niveles de colesterol y lpidos en la sangre Comience a realizarse anlisis de lpidos y colesterol en la sangre a los 20aos y luego reptalos cada 5aos. Es posible que necesite controlar los niveles de colesterol con mayor frecuencia si:  Sus niveles de lpidos y colesterol son altos.  Es mayor de 40aos.  Presenta un alto riesgo de padecer enfermedades cardacas. Qu debo saber sobre las pruebas de deteccin del cncer? Muchos tipos de cncer pueden detectarse de manera temprana y, a menudo, pueden prevenirse. Segn su historia clnica y sus antecedentes familiares, es posible que deba realizarse pruebas de deteccin del cncer en diferentes edades. Esto puede incluir pruebas de deteccin de lo siguiente:  Cncer colorrectal.  Cncer de prstata.  Cncer de piel.  Cncer de pulmn. Qu debo saber sobre la enfermedad cardaca, la diabetes y la hipertensin arterial? Presin arterial y enfermedad cardaca  La hipertensin arterial causa enfermedades cardacas y aumenta el riesgo de accidente cerebrovascular. Es ms   probable que esto se manifieste en las personas que tienen lecturas de presin arterial alta, tienen ascendencia africana o tienen sobrepeso.  Hable con el mdico sobre sus valores de presin arterial deseados.  Hgase controlar la presin  arterial: ? Cada 3 a 5 aos si tiene entre 18 y 39 aos. ? Todos los aos si es mayor de 40aos.  Si tiene entre 65 y 75 aos y es fumador o sola fumar, pregntele al mdico si debe realizarse una prueba de deteccin de aneurisma artico abdominal (AAA) por nica vez. Diabetes Realcese exmenes de deteccin de la diabetes con regularidad. Este anlisis revisa el nivel de azcar en la sangre en ayunas. Hgase las pruebas de deteccin:  Cada tresaos despus de los 45aos de edad si tiene un peso normal y un bajo riesgo de padecer diabetes.  Con ms frecuencia y a partir de una edad inferior si tiene sobrepeso o un alto riesgo de padecer diabetes. Qu debo saber sobre la prevencin de infecciones? Hepatitis B Si tiene un riesgo ms alto de contraer hepatitis B, debe someterse a un examen de deteccin de este virus. Hable con el mdico para averiguar si tiene riesgo de contraer la infeccin por hepatitis B. Hepatitis C Se recomienda un anlisis de sangre para:  Todos los que nacieron entre 1945 y 1965.  Todas las personas que tengan un riesgo de haber contrado hepatitis C. Enfermedades de transmisin sexual (ETS)  Debe realizarse pruebas de deteccin de ITS todos los aos, incluidas la gonorrea y la clamidia, si: ? Es sexualmente activo y es menor de 24aos. ? Es mayor de 24aos, y el mdico le informa que corre riesgo de tener este tipo de infecciones. ? La actividad sexual ha cambiado desde que le hicieron la ltima prueba de deteccin y tiene un riesgo mayor de tener clamidia o gonorrea. Pregntele al mdico si usted tiene riesgo.  Pregntele al mdico si usted tiene un alto riesgo de contraer VIH. El mdico tambin puede recomendarle un medicamento recetado para ayudar a evitar la infeccin por el VIH. Si elige tomar medicamentos para prevenir el VIH, primero debe hacerse los anlisis de deteccin del VIH. Luego debe hacerse anlisis cada 3meses mientras est tomando los  medicamentos. Siga estas instrucciones en su casa: Estilo de vida  No consuma ningn producto que contenga nicotina o tabaco, como cigarrillos, cigarrillos electrnicos y tabaco de mascar. Si necesita ayuda para dejar de fumar, consulte al mdico.  No consuma drogas.  No comparta agujas.  Solicite ayuda a su mdico si necesita apoyo o informacin para abandonar las drogas. Consumo de alcohol  No beba alcohol si el mdico se lo prohbe.  Si bebe alcohol: ? Limite la cantidad que consume de 0 a 2 medidas por da. ? Est atento a la cantidad de alcohol que hay en las bebidas que toma. En [place], una medida equivale a una botella de cerveza de 12oz (355ml), un vaso de vino de 5oz (148ml) o un vaso de una bebida alcohlica de alta graduacin de 1oz (44ml). Instrucciones generales  Realcese los estudios de rutina de la salud, dentales y de la vista.  Mantngase al da con las vacunas.  Infrmele a su mdico si: ? Se siente deprimido con frecuencia. ? Alguna vez ha sido vctima de maltrato o no se siente seguro en su casa. Resumen  Adoptar un estilo de vida saludable y recibir atencin preventiva son importantes para promover la salud y el bienestar.  Siga las instrucciones del mdico   acerca de una dieta saludable, el ejercicio y la realizacin de pruebas o exmenes para detectar enfermedades.  Siga las instrucciones del mdico con respecto al control del colesterol y la presin arterial. Esta informacin no tiene como fin reemplazar el consejo del mdico. Asegrese de hacerle al mdico cualquier pregunta que tenga. Document Revised: 05/20/2018 Document Reviewed: 05/20/2018 Elsevier Patient Education  2020 Elsevier Inc.  

## 2020-05-05 LAB — COMPREHENSIVE METABOLIC PANEL
ALT: 71 IU/L — ABNORMAL HIGH (ref 0–44)
AST: 47 IU/L — ABNORMAL HIGH (ref 0–40)
Albumin/Globulin Ratio: 1.9 (ref 1.2–2.2)
Albumin: 4.5 g/dL (ref 3.8–4.9)
Alkaline Phosphatase: 94 IU/L (ref 44–121)
BUN/Creatinine Ratio: 7 — ABNORMAL LOW (ref 9–20)
BUN: 8 mg/dL (ref 6–24)
Bilirubin Total: 0.6 mg/dL (ref 0.0–1.2)
CO2: 22 mmol/L (ref 20–29)
Calcium: 9.8 mg/dL (ref 8.7–10.2)
Chloride: 103 mmol/L (ref 96–106)
Creatinine, Ser: 1.12 mg/dL (ref 0.76–1.27)
GFR calc Af Amer: 85 mL/min/{1.73_m2} (ref >59)
GFR calc non Af Amer: 74 mL/min/{1.73_m2} (ref >59)
Globulin, Total: 2.4 g/dL (ref 1.5–4.5)
Glucose: 87 mg/dL (ref 65–99)
Potassium: 4.9 mmol/L (ref 3.5–5.2)
Sodium: 139 mmol/L (ref 134–144)
Total Protein: 6.9 g/dL (ref 6.0–8.5)

## 2020-05-05 LAB — CBC WITH DIFFERENTIAL/PLATELET
Basophils Absolute: 0 10*3/uL (ref 0.0–0.2)
Basos: 0 %
EOS (ABSOLUTE): 0.1 10*3/uL (ref 0.0–0.4)
Eos: 2 %
Hematocrit: 45.6 % (ref 37.5–51.0)
Hemoglobin: 15.8 g/dL (ref 13.0–17.7)
Immature Grans (Abs): 0 10*3/uL (ref 0.0–0.1)
Immature Granulocytes: 0 %
Lymphocytes Absolute: 2.8 10*3/uL (ref 0.7–3.1)
Lymphs: 41 %
MCH: 32.2 pg (ref 26.6–33.0)
MCHC: 34.6 g/dL (ref 31.5–35.7)
MCV: 93 fL (ref 79–97)
Monocytes Absolute: 0.7 10*3/uL (ref 0.1–0.9)
Monocytes: 10 %
Neutrophils Absolute: 3.2 10*3/uL (ref 1.4–7.0)
Neutrophils: 47 %
Platelets: 431 10*3/uL (ref 150–450)
RBC: 4.9 x10E6/uL (ref 4.14–5.80)
RDW: 12.9 % (ref 11.6–15.4)
WBC: 6.9 10*3/uL (ref 3.4–10.8)

## 2020-05-05 LAB — HEMOGLOBIN A1C
Est. average glucose Bld gHb Est-mCnc: 120 mg/dL
Hgb A1c MFr Bld: 5.8 % — ABNORMAL HIGH (ref 4.8–5.6)

## 2020-05-05 LAB — SAR COV2 SEROLOGY (COVID19)AB(IGG),IA
SARS-CoV-2 Semi-Quant IgG Ab: 603 AU/mL (ref <13.0)
SARS-CoV-2 Spike Ab Interp: POSITIVE

## 2020-05-05 LAB — PSA: Prostate Specific Ag, Serum: 1.9 ng/mL (ref 0.0–4.0)

## 2020-05-11 ENCOUNTER — Encounter: Payer: Self-pay | Admitting: Gastroenterology

## 2020-05-31 ENCOUNTER — Ambulatory Visit: Payer: Managed Care, Other (non HMO) | Admitting: Gastroenterology

## 2020-05-31 ENCOUNTER — Encounter: Payer: Self-pay | Admitting: Gastroenterology

## 2020-05-31 ENCOUNTER — Other Ambulatory Visit (INDEPENDENT_AMBULATORY_CARE_PROVIDER_SITE_OTHER): Payer: Managed Care, Other (non HMO)

## 2020-05-31 ENCOUNTER — Other Ambulatory Visit: Payer: Self-pay

## 2020-05-31 VITALS — BP 136/82 | HR 70 | Ht 66.75 in | Wt 185.1 lb

## 2020-05-31 DIAGNOSIS — R140 Abdominal distension (gaseous): Secondary | ICD-10-CM

## 2020-05-31 DIAGNOSIS — Z1211 Encounter for screening for malignant neoplasm of colon: Secondary | ICD-10-CM | POA: Diagnosis not present

## 2020-05-31 DIAGNOSIS — K219 Gastro-esophageal reflux disease without esophagitis: Secondary | ICD-10-CM | POA: Diagnosis not present

## 2020-05-31 DIAGNOSIS — Z1212 Encounter for screening for malignant neoplasm of rectum: Secondary | ICD-10-CM

## 2020-05-31 LAB — H. PYLORI ANTIBODY, IGG: H Pylori IgG: NEGATIVE

## 2020-05-31 MED ORDER — PANTOPRAZOLE SODIUM 40 MG PO TBEC: 40.0000 mg | DELAYED_RELEASE_TABLET | Freq: Every day | ORAL | 11 refills | Status: AC

## 2020-05-31 MED ORDER — PLENVU 140 G PO SOLR: ORAL | 0 refills | Status: DC

## 2020-05-31 NOTE — Patient Instructions (Addendum)
If you are age 56 or older, your body mass index should be between 23-30. Your Body mass index is 29.21 kg/m. If this is out of the aforementioned range listed, please consider follow up with your Primary Care Provider.  If you are age 54 or younger, your body mass index should be between 19-25. Your Body mass index is 29.21 kg/m. If this is out of the aformentioned range listed, please consider follow up with your Primary Care Provider.   You have been scheduled for an endoscopy and colonoscopy. Please follow the written instructions given to you at your visit today. Please pick up your prep supplies at the pharmacy within the next 1-3 days. If you use inhalers (even only as needed), please bring them with you on the day of your procedure.  Your provider has requested that you go to the basement level for lab work before leaving today. Press "B" on the elevator. The lab is located at the first door on the left as you exit the elevator.   We have sent the following medications to your pharmacy for you to pick up at your convenience:Pantoprazole 40  mg   Thank you for choosing me and [facility].  Alonza Bogus, PA-C

## 2020-05-31 NOTE — Progress Notes (Signed)
05/31/2020 Russell Hunt [id number] 02/14/1966   HISTORY OF PRESENT ILLNESS: This is a 55 year old Hispanic male who is new to our office.  He has been referred here by Tresa Moore, FNP, for evaluation regarding GERD and abdominal bloating.  He says that he has reflux pretty much every day, sometimes multiple times a day.  He was taking pantoprazole 40 mg daily for 30 days and says that that definitely seemed to help, but then his PCP did not refill it.  He also complains of bloating and being full of gas after eating.  He denies any abdominal pain.  He says that the bloating and gassiness has been present for about the past 3 to 4 months.  He denies any nausea or vomiting.  He denies constipation or diarrhea.  He's never had colonoscopy in the past.  Patient is primarily Spanish-speaking so the entirety of the visit was performed via interpreter/translator.   Past Medical History:  Diagnosis Date  . Allergy    Past Surgical History:  Procedure Laterality Date  . HERNIA REPAIR      reports that he has been smoking cigarettes. He has a 15.00 pack-year smoking history. He has never used smokeless tobacco. He reports current alcohol use. He reports that he does not use drugs. family history includes Diabetes in his mother. No Known Allergies    Outpatient Encounter Medications as of 05/31/2020  Medication Sig  . cyclobenzaprine (FLEXERIL) 10 MG tablet Take 1 tablet (10 mg total) by mouth 3 (three) times daily as needed. for muscle spams (Patient not taking: Reported on 05/04/2020)  . docusate sodium (COLACE) 100 MG capsule Take 1 capsule (100 mg total) by mouth every 12 (twelve) hours. (Patient not taking: Reported on 05/04/2020)  . esomeprazole (NEXIUM) 20 MG capsule Take 20 mg by mouth daily at 12 noon. (Patient not taking: Reported on 05/04/2020)  . famotidine (PEPCID) 20 MG tablet Take 20 mg by mouth 2 (two) times daily. (Patient not taking: Reported on 05/04/2020)  .  HYDROcodone-acetaminophen (NORCO/VICODIN) 5-325 MG tablet Take 1 tablet by mouth every 6 (six) hours as needed for moderate pain. (Patient not taking: Reported on 05/04/2020)  . ibuprofen (ADVIL,MOTRIN) 200 MG tablet Take 200 mg by mouth every 6 (six) hours as needed for mild pain. (Patient not taking: Reported on 05/04/2020)  . meloxicam (MOBIC) 7.5 MG tablet Take 1 tablet (7.5 mg total) by mouth daily. (Patient not taking: Reported on 05/04/2020)  . naproxen (NAPROSYN) 500 MG tablet Take 1 tablet (500 mg total) by mouth 2 (two) times daily with a meal. (Patient not taking: Reported on 05/04/2020)  . pantoprazole (PROTONIX) 40 MG tablet Take 40 mg by mouth daily. (Patient not taking: Reported on 05/04/2020)  . Simethicone 125 MG CAPS Take 1 capsule by mouth 2 (two) times daily. (Patient not taking: Reported on 05/04/2020)   No facility-administered encounter medications on file as of 05/31/2020.     REVIEW OF SYSTEMS  : All other systems reviewed and negative except where noted in the History of Present Illness.   PHYSICAL EXAM: BP 136/82 (BP Location: Right Arm, Patient Position: Sitting, Cuff Size: Normal)   Pulse 70   Ht 5' 6.75" (1.695 m)   Wt 185 lb 2 oz (84 kg)   BMI 29.21 kg/m  General: Well developed Hispanic male in no acute distress Head: Normocephalic and atraumatic Eyes:  Sclerae anicteric, conjunctiva pink. Ears: Normal auditory acuity Lungs: Clear throughout to auscultation; no W/R/R. Heart: Regular rate  and rhythm; no M/R/G. Abdomen: Soft, non-distended.  BS present.  Non-tender. Rectal:  Will be done at the time of colonoscopy. Musculoskeletal: Symmetrical with no gross deformities  Skin: No lesions on visible extremities Extremities: No edema  Neurological: Alert oriented x 4, grossly non-focal Psychological:  Alert and cooperative. Normal mood and affect  ASSESSMENT AND PLAN: *GERD and abdominal bloating: I am going to have him restart the pantoprazole 40 mg  daily since that did help with his reflux.  Prescription sent to pharmacy.  We will check H. pylori serology.  We will plan for EGD with Dr. Silverio Decamp. *CRC screening:  Never had colonoscopy in the past.  Will schedule that with Dr. Silverio Decamp as well.  **The risks, benefits, and alternatives to EGD and colonoscopy were discussed with the patient and he consents to proceed.    CC:  Mitchel Honour [place], *  CC:  Tresa Moore, FNP

## 2020-07-10 NOTE — Progress Notes (Signed)
Reviewed and agree with documentation and assessment and plan. K. Veena Nandigam , MD   

## 2020-07-25 ENCOUNTER — Encounter: Payer: Self-pay | Admitting: Gastroenterology

## 2020-07-28 ENCOUNTER — Ambulatory Visit (AMBULATORY_SURGERY_CENTER): Payer: Managed Care, Other (non HMO) | Admitting: Gastroenterology

## 2020-07-28 ENCOUNTER — Encounter: Payer: Self-pay | Admitting: Gastroenterology

## 2020-07-28 ENCOUNTER — Other Ambulatory Visit: Payer: Self-pay

## 2020-07-28 VITALS — BP 95/70 | HR 72 | Temp 97.3°F | Resp 21 | Ht 66.0 in | Wt 185.0 lb

## 2020-07-28 DIAGNOSIS — D123 Benign neoplasm of transverse colon: Secondary | ICD-10-CM | POA: Diagnosis not present

## 2020-07-28 DIAGNOSIS — K2100 Gastro-esophageal reflux disease with esophagitis, without bleeding: Secondary | ICD-10-CM | POA: Diagnosis not present

## 2020-07-28 DIAGNOSIS — K269 Duodenal ulcer, unspecified as acute or chronic, without hemorrhage or perforation: Secondary | ICD-10-CM

## 2020-07-28 DIAGNOSIS — Z1212 Encounter for screening for malignant neoplasm of rectum: Secondary | ICD-10-CM

## 2020-07-28 DIAGNOSIS — Z1211 Encounter for screening for malignant neoplasm of colon: Secondary | ICD-10-CM

## 2020-07-28 DIAGNOSIS — K2980 Duodenitis without bleeding: Secondary | ICD-10-CM | POA: Diagnosis not present

## 2020-07-28 DIAGNOSIS — K219 Gastro-esophageal reflux disease without esophagitis: Secondary | ICD-10-CM

## 2020-07-28 MED ORDER — SODIUM CHLORIDE 0.9 % IV SOLN: 500.0000 mL | Freq: Once | INTRAVENOUS | Status: DC

## 2020-07-28 MED ORDER — OMEPRAZOLE 40 MG PO CPDR: 40.0000 mg | DELAYED_RELEASE_CAPSULE | Freq: Every day | ORAL | 3 refills | Status: AC

## 2020-07-28 NOTE — Progress Notes (Signed)
Called to room to assist during endoscopic procedure.  Patient ID and intended procedure confirmed with present staff. Received instructions for my participation in the procedure from the performing physician.  

## 2020-07-28 NOTE — Progress Notes (Signed)
Interpreter, [place], present during admission

## 2020-07-28 NOTE — Progress Notes (Signed)
VS by CW. ?

## 2020-07-28 NOTE — Patient Instructions (Addendum)
Resume previous diet Continue current medications Await pathoogy results Start taking omeprazole 40mg  once daily YOU HAD AN ENDOSCOPIC PROCEDURE TODAY AT [facility]:   Refer to the procedure report that was given to you for any specific questions about what was found during the examination.  If the procedure report does not answer your questions, please call your gastroenterologist to clarify.  If you requested that your care partner not be given the details of your procedure findings, then the procedure report has been included in a sealed envelope for you to review at your convenience later.  YOU SHOULD EXPECT: Some feelings of bloating in the abdomen. Passage of more gas than usual.  Walking can help get rid of the air that was put into your GI tract during the procedure and reduce the bloating. If you had a lower endoscopy (such as a colonoscopy or flexible sigmoidoscopy) you may notice spotting of blood in your stool or on the toilet paper. If you underwent a bowel prep for your procedure, you may not have a normal bowel movement for a few days.  Please Note:  You might notice some irritation and congestion in your nose or some drainage.  This is from the oxygen used during your procedure.  There is no need for concern and it should clear up in a day or so.  SYMPTOMS TO REPORT IMMEDIATELY:   Following lower endoscopy (colonoscopy or flexible sigmoidoscopy):  Excessive amounts of blood in the stool  Significant tenderness or worsening of abdominal pains  Swelling of the abdomen that is new, acute  Fever of 100F or higher   Following upper endoscopy (EGD)  Vomiting of blood or coffee ground material  New chest pain or pain under the shoulder blades  Painful or persistently difficult swallowing  New shortness of breath  Fever of 100F or higher  Black, tarry-looking stools  For urgent or emergent issues, a gastroenterologist can be reached at any hour by calling [phone]  [phone]. Do not use MyChart messaging for urgent concerns.   DIET:  We do recommend a small meal at first, but then you may proceed to your regular diet.  Drink plenty of fluids but you should avoid alcoholic beverages for 24 hours.  ACTIVITY:  You should plan to take it easy for the rest of today and you should NOT DRIVE or use heavy machinery until tomorrow (because of the sedation medicines used during the test).    FOLLOW UP: Our staff will call the number listed on your records 48-72 hours following your procedure to check on you and address any questions or concerns that you may have regarding the information given to you following your procedure. If we do not reach you, we will leave a message.  We will attempt to reach you two times.  During this call, we will ask if you have developed any symptoms of COVID 19. If you develop any symptoms (ie: fever, flu-like symptoms, shortness of breath, cough etc.) before then, please call [phone].  If you test positive for Covid 19 in the 2 weeks post procedure, please call and report this information to [place].    If any biopsies were taken you will be contacted by phone or by letter within the next 1-3 weeks.  Please call us at [phone] if you have not heard about the biopsies in 3 weeks.   SIGNATURES/CONFIDENTIALITY: You and/or your care partner have signed paperwork which will be entered into your electronic medical record.  These signatures attest to the fact that that the information above on your After Visit Summary has been reviewed and is understood.  Full responsibility of the confidentiality of this discharge information lies with you and/or your care-partner.

## 2020-07-28 NOTE — Op Note (Signed)
[place] Patient Name: Russell Hunt Procedure Date: 07/28/2020 1:59 PM MRN: [id number] Endoscopist: Mauri Pole , MD Age: 55 Referring MD:  Date of Birth: [birth year] Gender: Male Account #: [id number] Procedure:                Upper GI endoscopy Indications:              Epigastric abdominal pain, Esophageal reflux                            symptoms that persist despite appropriate therapy Medicines:                Monitored Anesthesia Care Procedure:                Pre-Anesthesia Assessment:                           - Prior to the procedure, a History and Physical                            was performed, and patient medications and                            allergies were reviewed. The patient's tolerance of                            previous anesthesia was also reviewed. The risks                            and benefits of the procedure and the sedation                            options and risks were discussed with the patient.                            All questions were answered, and informed consent                            was obtained. Prior Anticoagulants: The patient has                            taken no previous anticoagulant or antiplatelet                            agents. ASA Grade Assessment: II - A patient with                            mild systemic disease. After reviewing the risks                            and benefits, the patient was deemed in                            satisfactory condition to undergo the procedure.  After obtaining informed consent, the endoscope was                            passed under direct vision. Throughout the                            procedure, the patient's blood pressure, pulse, and                            oxygen saturations were monitored continuously. The                            Endoscope was introduced through the mouth, and                             advanced to the second part of duodenum. The upper                            GI endoscopy was accomplished without difficulty.                            The patient tolerated the procedure well. Scope In: Scope Out: Findings:                 LA Grade C (one or more mucosal breaks continuous                            between tops of 2 or more mucosal folds, less than                            75% circumference) esophagitis with nodularity, no                            bleeding was found 33 to 36 cm from the incisors.                            Biopsies were taken with a cold forceps for                            histology.                           The entire examined stomach was normal.                           The cardia and gastric fundus were normal on                            retroflexion.                           Few non-bleeding superficial duodenal ulcers with                            no stigmata of  bleeding were found in the duodenal                            bulb. The largest lesion was 3 mm in largest                            dimension. Biopsies were taken with a cold forceps                            for histology. Complications:            No immediate complications. Estimated Blood Loss:     Estimated blood loss was minimal. Impression:               - LA Grade C reflux esophagitis with no bleeding.                            Biopsied.                           - Normal stomach.                           - Non-bleeding duodenal ulcers with no stigmata of                            bleeding. Biopsied. Recommendation:           - Resume previous diet.                           - Continue present medications.                           - Await pathology results.                           - Use Prilosec (omeprazole) 40 mg PO daily X 90                            tabs with 3 refills.                           - Follow an antireflux regimen. Mauri Pole,  MD 07/28/2020 2:44:16 PM This report has been signed electronically.

## 2020-07-28 NOTE — Op Note (Signed)
[name] Patient Name: Russell Hunt Procedure Date: 07/28/2020 1:58 PM MRN: [id number] Endoscopist: Mauri Pole , MD Age: 55 Referring MD:  Date of Birth: 07-12-1965 Gender: Male Account #: [id number] Procedure:                Colonoscopy Indications:              Screening for colorectal malignant neoplasm Medicines:                Monitored Anesthesia Care Procedure:                Pre-Anesthesia Assessment:                           - Prior to the procedure, a History and Physical                            was performed, and patient medications and                            allergies were reviewed. The patient's tolerance of                            previous anesthesia was also reviewed. The risks                            and benefits of the procedure and the sedation                            options and risks were discussed with the patient.                            All questions were answered, and informed consent                            was obtained. Prior Anticoagulants: The patient has                            taken no previous anticoagulant or antiplatelet                            agents. ASA Grade Assessment: II - A patient with                            mild systemic disease. After reviewing the risks                            and benefits, the patient was deemed in                            satisfactory condition to undergo the procedure.                           After obtaining informed consent, the colonoscope  was passed under direct vision. Throughout the                            procedure, the patient's blood pressure, pulse, and                            oxygen saturations were monitored continuously. The                            Olympus PCF-H190DL (#[id number]) Colonoscope was                            introduced through the anus and advanced to the the                            cecum,  identified by appendiceal orifice and                            ileocecal valve. The colonoscopy was performed                            without difficulty. The patient tolerated the                            procedure well. The quality of the bowel                            preparation was excellent. The ileocecal valve,                            appendiceal orifice, and rectum were photographed. Scope In: 2:21:42 PM Scope Out: 2:37:19 PM Scope Withdrawal Time: 0 hours 10 minutes 37 seconds  Total Procedure Duration: 0 hours 15 minutes 37 seconds  Findings:                 The perianal and digital rectal examinations were                            normal.                           Two sessile polyps were found in the transverse                            colon. The polyps were 5 to 7 mm in size. These                            polyps were removed with a cold snare. Resection                            and retrieval were complete.                           Non-bleeding external and internal hemorrhoids were  found during retroflexion. The hemorrhoids were                            medium-sized.                           The exam was otherwise without abnormality. Complications:            No immediate complications. Estimated Blood Loss:     Estimated blood loss was minimal. Impression:               - Two 5 to 7 mm polyps in the transverse colon,                            removed with a cold snare. Resected and retrieved.                           - Non-bleeding external and internal hemorrhoids.                           - The examination was otherwise normal. Recommendation:           - Patient has a contact number available for                            emergencies. The signs and symptoms of potential                            delayed complications were discussed with the                            patient. Return to normal activities tomorrow.                             Written discharge instructions were provided to the                            patient.                           - Resume previous diet.                           - Continue present medications.                           - Await pathology results.                           - Repeat colonoscopy in 5-10 years for surveillance                            based on pathology results. Mauri Pole, MD 07/28/2020 2:46:20 PM This report has been signed electronically.

## 2020-07-28 NOTE — Progress Notes (Signed)
PT taken to PACU. Monitors in place. VSS. Report given to RN. 

## 2020-08-01 ENCOUNTER — Telehealth: Payer: Self-pay

## 2020-08-01 ENCOUNTER — Telehealth: Payer: Self-pay | Admitting: *Deleted

## 2020-08-01 NOTE — Telephone Encounter (Signed)
No voicemail and unable to leave message.

## 2020-08-01 NOTE — Telephone Encounter (Signed)
  Follow up Call-  Call back number 07/28/2020  Post procedure Call Back phone  # [phone]  Permission to leave phone message Yes  Some recent data might be hidden     Patient questions:  Do you have a fever, pain , or abdominal swelling? No. Pain Score  0 *  Have you tolerated food without any problems? Yes.    Have you been able to return to your normal activities? Yes.    Do you have any questions about your discharge instructions: Diet   No. Medications  No. Follow up visit  No.  Do you have questions or concerns about your Care? No.  Actions: * If pain score is 4 or above: 1. No action needed, pain <4.Have you developed a fever since your procedure? nno  2.   Have you had an respiratory symptoms (SOB or cough) since your procedure? no  3.   Have you tested positive for COVID 19 since your procedure no  4.   Have you had any family members/close contacts diagnosed with the COVID 19 since your procedure?  no   If yes to any of these questions please route to Joylene John, RN and Joella Prince, RN

## 2020-08-07 ENCOUNTER — Encounter: Payer: Self-pay | Admitting: Gastroenterology

## 2021-05-29 ENCOUNTER — Encounter (HOSPITAL_COMMUNITY): Payer: Self-pay | Admitting: Emergency Medicine

## 2021-05-29 ENCOUNTER — Ambulatory Visit (HOSPITAL_COMMUNITY)
Admission: EM | Admit: 2021-05-29 | Discharge: 2021-05-29 | Disposition: A | Discharge status: Home / Self Care | Payer: Managed Care, Other (non HMO) | Attending: Family Medicine | Admitting: Family Medicine

## 2021-05-29 ENCOUNTER — Other Ambulatory Visit: Payer: Self-pay

## 2021-05-29 DIAGNOSIS — R059 Cough, unspecified: Secondary | ICD-10-CM | POA: Insufficient documentation

## 2021-05-29 DIAGNOSIS — F17210 Nicotine dependence, cigarettes, uncomplicated: Secondary | ICD-10-CM | POA: Insufficient documentation

## 2021-05-29 DIAGNOSIS — R5381 Other malaise: Secondary | ICD-10-CM | POA: Diagnosis not present

## 2021-05-29 DIAGNOSIS — R5383 Other fatigue: Secondary | ICD-10-CM | POA: Diagnosis not present

## 2021-05-29 DIAGNOSIS — R52 Pain, unspecified: Secondary | ICD-10-CM | POA: Insufficient documentation

## 2021-05-29 DIAGNOSIS — B349 Viral infection, unspecified: Secondary | ICD-10-CM | POA: Insufficient documentation

## 2021-05-29 DIAGNOSIS — J069 Acute upper respiratory infection, unspecified: Secondary | ICD-10-CM | POA: Insufficient documentation

## 2021-05-29 DIAGNOSIS — U071 COVID-19: Secondary | ICD-10-CM | POA: Insufficient documentation

## 2021-05-29 DIAGNOSIS — R509 Fever, unspecified: Secondary | ICD-10-CM | POA: Insufficient documentation

## 2021-05-29 LAB — RESPIRATORY PANEL BY PCR

## 2021-05-29 LAB — SARS CORONAVIRUS 2 (TAT 6-24 HRS): SARS Coronavirus 2: POSITIVE — AB

## 2021-05-29 MED ORDER — CETIRIZINE HCL 10 MG PO TABS: 10.0000 mg | ORAL_TABLET | Freq: Every day | ORAL | 0 refills | Status: AC

## 2021-05-29 MED ORDER — PSEUDOEPHEDRINE HCL 60 MG PO TABS
60.0000 mg | ORAL_TABLET | Freq: Three times a day (TID) | ORAL | 0 refills | Status: AC | PRN
Start: 2021-05-29 — End: ?

## 2021-05-29 MED ORDER — PROMETHAZINE-DM 6.25-15 MG/5ML PO SYRP: 5.0000 mL | ORAL_SOLUTION | Freq: Every evening | ORAL | 0 refills | Status: AC | PRN

## 2021-05-29 MED ORDER — BENZONATATE 100 MG PO CAPS
100.0000 mg | ORAL_CAPSULE | Freq: Three times a day (TID) | ORAL | 0 refills | Status: AC | PRN
Start: 2021-05-29 — End: ?

## 2021-05-29 NOTE — ED Provider Notes (Signed)
[place]   MRN: [id number] DOB: 05/06/1966  Subjective:   Russell Hunt is a 56 y.o. male presenting for 5 day history of malaise, fatigue, body aches, cough, chills, fevers, congestion. Has not had COVID or flu this season. He is a smoker, does 1/4 ppd. No chest pain, shob, wheezing,sinus pain, throat pain, ear pain. No history of respiratory disorders. No history of diabetes.   No current facility-administered medications for this encounter.  Current Outpatient Medications:    cyclobenzaprine (FLEXERIL) 10 MG tablet, Take 1 tablet (10 mg total) by mouth 3 (three) times daily as needed. for muscle spams (Patient not taking: No sig reported), Disp: 30 tablet, Rfl: 0   docusate sodium (COLACE) 100 MG capsule, Take 1 capsule (100 mg total) by mouth every 12 (twelve) hours. (Patient not taking: No sig reported), Disp: 60 capsule, Rfl: 0   esomeprazole (NEXIUM) 20 MG capsule, Take 20 mg by mouth daily at 12 noon. (Patient not taking: No sig reported), Disp: , Rfl:    famotidine (PEPCID) 20 MG tablet, Take 20 mg by mouth 2 (two) times daily. (Patient not taking: No sig reported), Disp: , Rfl:    ibuprofen (ADVIL,MOTRIN) 200 MG tablet, Take 200 mg by mouth every 6 (six) hours as needed for mild pain. (Patient not taking: No sig reported), Disp: , Rfl:    meloxicam (MOBIC) 7.5 MG tablet, Take 1 tablet (7.5 mg total) by mouth daily. (Patient not taking: No sig reported), Disp: 30 tablet, Rfl: 0   naproxen (NAPROSYN) 500 MG tablet, Take 1 tablet (500 mg total) by mouth 2 (two) times daily with a meal. (Patient not taking: No sig reported), Disp: 30 tablet, Rfl: 0   omeprazole (PRILOSEC) 40 MG capsule, Take 1 capsule (40 mg total) by mouth daily. Take only with water and at least 30 min before eating, Disp: 90 capsule, Rfl: 3   pantoprazole (PROTONIX) 40 MG tablet, Take 1 tablet (40 mg total) by mouth daily. (Patient not taking: Reported on 07/28/2020), Disp: 30 tablet, Rfl:  11   Simethicone 125 MG CAPS, Take 1 capsule by mouth 2 (two) times daily. (Patient not taking: No sig reported), Disp: , Rfl:    No Known Allergies  Past Medical History:  Diagnosis Date   Allergy    GERD (gastroesophageal reflux disease)      Past Surgical History:  Procedure Laterality Date   HERNIA REPAIR      Family History  Problem Relation Age of Onset   Diabetes Mother    Colon cancer Neg Hx    Esophageal cancer Neg Hx    Pancreatic cancer Neg Hx    Stomach cancer Neg Hx     Social History   Tobacco Use   Smoking status: Every Day    Packs/day: 0.50    Years: 30.00    Pack years: 15.00    Types: Cigarettes   Smokeless tobacco: Never  Vaping Use   Vaping Use: Never used  Substance Use Topics   Alcohol use: Yes    Comment: once in a while   Drug use: No    ROS   Objective:   Vitals: BP 126/84 (BP Location: Right Arm)    Pulse 63    Temp 98.4 F (36.9 C) (Oral)    Resp 16    SpO2 96%   Physical Exam Constitutional:      General: He is not in acute distress.    Appearance: Normal appearance. He is  well-developed. He is not ill-appearing, toxic-appearing or diaphoretic.  HENT:     Head: Normocephalic and atraumatic.     Right Ear: External ear normal.     Left Ear: External ear normal.     Nose: Nose normal.     Mouth/Throat:     Mouth: Mucous membranes are moist.  Eyes:     General: No scleral icterus.       Right eye: No discharge.        Left eye: No discharge.     Extraocular Movements: Extraocular movements intact.     Conjunctiva/sclera: Conjunctivae normal.  Cardiovascular:     Rate and Rhythm: Normal rate and regular rhythm.     Heart sounds: Normal heart sounds. No murmur heard.   No friction rub. No gallop.  Pulmonary:     Effort: Pulmonary effort is normal. No respiratory distress.     Breath sounds: Normal breath sounds. No stridor. No wheezing, rhonchi or rales.  Neurological:     Mental Status: He is alert and oriented to  person, place, and time.  Psychiatric:        Mood and Affect: Mood normal.        Behavior: Behavior normal.        Thought Content: Thought content normal.    Assessment and Plan :   PDMP not reviewed this encounter.  1. Viral URI with cough   2. Body aches    Deferred imaging given clear cardiopulmonary exam, hemodynamically stable vital signs. COVID and flu test pending.  We will otherwise manage for viral upper respiratory infection.  Physical exam findings reassuring and vital signs stable for discharge. Advised supportive care, offered symptomatic relief. Counseled patient on potential for adverse effects with medications prescribed/recommended today, ER and return-to-clinic precautions discussed, patient verbalized understanding.       [name], [place] 05/29/21 [phone]

## 2021-05-29 NOTE — Discharge Instructions (Addendum)
Para el dolor de garganta o tos puede usar un t de miel. Use 3 cucharaditas de miel con jugo exprimido de [employer]. Coloque trozos de P[employer] scheme manager en 1/2-1 taza de agua y caliente sobre la estufa. Luego mezcle los ingredientes y repita cada 4 horas. Para fiebre, dolores de cuerpo tome ibuprofeno 400mg -600mg  con comida cada 6 horas alternando con o junto con Tylenol 500mg -650mg  cada 6 horas. Hidrata muy bien con al menos 2 litros (64 onzas) de agua al dia. Coma comidas ligeras como sopas para [employer] y nutricion. Tambien puede tomar suero. Comience un antihistamnico como Zyrtec (cetirizina) 10mg  al dia. Puede usar pseudoefedrina (Sudafed) de venta libre para el goteo posnasal, congestin a una dosis de 60 mg cada 8 horas o cada 12 horas. Use el jarabe por la noche para su tos y las capsulas durante el dia.

## 2021-05-29 NOTE — ED Triage Notes (Signed)
Pt reports since last Thursday having body aches, cough, congestion, chills, fevers.
